# Patient Record
Sex: Male | Born: 1940 | Race: White | Hispanic: No | Marital: Married | Smoking: Heavy tobacco smoker
Health system: Southern US, Community
[De-identification: ages and names within clinical notes are randomized; demographics above are authoritative.]

## PROBLEM LIST (undated history)

## (undated) DIAGNOSIS — I10 Essential (primary) hypertension: Secondary | ICD-10-CM

## (undated) DIAGNOSIS — I639 Cerebral infarction, unspecified: Secondary | ICD-10-CM

## (undated) DIAGNOSIS — H539 Unspecified visual disturbance: Secondary | ICD-10-CM

## (undated) DIAGNOSIS — I4891 Unspecified atrial fibrillation: Secondary | ICD-10-CM

## (undated) HISTORY — DX: Cerebral infarction, unspecified: I63.9

## (undated) HISTORY — DX: Unspecified visual disturbance: H53.9

## (undated) HISTORY — DX: Essential (primary) hypertension: I10

## (undated) HISTORY — DX: Unspecified atrial fibrillation: I48.91

---

## 2019-06-15 ENCOUNTER — Inpatient Hospital Stay (HOSPITAL_BASED_OUTPATIENT_CLINIC_OR_DEPARTMENT_OTHER): Payer: Managed Care, Other (non HMO)

## 2019-06-15 ENCOUNTER — Other Ambulatory Visit: Payer: Self-pay

## 2019-06-15 ENCOUNTER — Inpatient Hospital Stay (HOSPITAL_COMMUNITY): Payer: Managed Care, Other (non HMO)

## 2019-06-15 ENCOUNTER — Encounter (HOSPITAL_COMMUNITY): Payer: Self-pay | Admitting: Emergency Medicine

## 2019-06-15 ENCOUNTER — Observation Stay (HOSPITAL_COMMUNITY)
Admission: EM | Admit: 2019-06-15 | Discharge: 2019-06-16 | Disposition: A | Discharge status: Home / Self Care | Payer: Managed Care, Other (non HMO) | Attending: Internal Medicine | Admitting: Internal Medicine

## 2019-06-15 DIAGNOSIS — Z20822 Contact with and (suspected) exposure to covid-19: Secondary | ICD-10-CM | POA: Diagnosis not present

## 2019-06-15 DIAGNOSIS — Z79899 Other long term (current) drug therapy: Secondary | ICD-10-CM | POA: Insufficient documentation

## 2019-06-15 DIAGNOSIS — I6389 Other cerebral infarction: Secondary | ICD-10-CM

## 2019-06-15 DIAGNOSIS — Z6828 Body mass index (BMI) 28.0-28.9, adult: Secondary | ICD-10-CM | POA: Insufficient documentation

## 2019-06-15 DIAGNOSIS — F17210 Nicotine dependence, cigarettes, uncomplicated: Secondary | ICD-10-CM | POA: Insufficient documentation

## 2019-06-15 DIAGNOSIS — Z23 Encounter for immunization: Secondary | ICD-10-CM | POA: Diagnosis not present

## 2019-06-15 DIAGNOSIS — E785 Hyperlipidemia, unspecified: Secondary | ICD-10-CM | POA: Insufficient documentation

## 2019-06-15 DIAGNOSIS — Z7982 Long term (current) use of aspirin: Secondary | ICD-10-CM | POA: Diagnosis not present

## 2019-06-15 DIAGNOSIS — E663 Overweight: Secondary | ICD-10-CM | POA: Diagnosis not present

## 2019-06-15 DIAGNOSIS — I10 Essential (primary) hypertension: Secondary | ICD-10-CM | POA: Insufficient documentation

## 2019-06-15 DIAGNOSIS — Z8249 Family history of ischemic heart disease and other diseases of the circulatory system: Secondary | ICD-10-CM | POA: Diagnosis not present

## 2019-06-15 DIAGNOSIS — E7800 Pure hypercholesterolemia, unspecified: Secondary | ICD-10-CM | POA: Insufficient documentation

## 2019-06-15 DIAGNOSIS — I639 Cerebral infarction, unspecified: Secondary | ICD-10-CM | POA: Diagnosis present

## 2019-06-15 LAB — COMPREHENSIVE METABOLIC PANEL
ALT: 34 U/L (ref 0–44)
AST: 33 U/L (ref 15–41)
Albumin: 3.9 g/dL (ref 3.5–5.0)
Alkaline Phosphatase: 48 U/L (ref 38–126)
Anion gap: 11 (ref 5–15)
BUN: 11 mg/dL (ref 8–23)
CO2: 26 mmol/L (ref 22–32)
Calcium: 10 mg/dL (ref 8.9–10.3)
Chloride: 103 mmol/L (ref 98–111)
Creatinine, Ser: 1.16 mg/dL (ref 0.61–1.24)
GFR calc Af Amer: >60 mL/min (ref >60)
GFR calc non Af Amer: >60 mL/min (ref >60)
Glucose, Bld: 109 mg/dL — ABNORMAL HIGH (ref 70–99)
Potassium: 4.2 mmol/L (ref 3.5–5.1)
Sodium: 140 mmol/L (ref 135–145)
Total Bilirubin: 0.9 mg/dL (ref 0.3–1.2)
Total Protein: 6.9 g/dL (ref 6.5–8.1)

## 2019-06-15 LAB — HEMOGLOBIN A1C
Hgb A1c MFr Bld: 5.5 % (ref 4.8–5.6)
Mean Plasma Glucose: 111.15 mg/dL

## 2019-06-15 LAB — CBC
HCT: 47.4 % (ref 39.0–52.0)
Hemoglobin: 16.2 g/dL (ref 13.0–17.0)
MCH: 33.8 pg (ref 26.0–34.0)
MCHC: 34.2 g/dL (ref 30.0–36.0)
MCV: 98.8 fL (ref 80.0–100.0)
Platelets: 240 10*3/uL (ref 150–400)
RBC: 4.8 MIL/uL (ref 4.22–5.81)
RDW: 12.6 % (ref 11.5–15.5)
WBC: 7.5 10*3/uL (ref 4.0–10.5)
nRBC: 0 % (ref 0.0–0.2)

## 2019-06-15 LAB — DIFFERENTIAL
Abs Immature Granulocytes: 0.02 10*3/uL (ref 0.00–0.07)
Basophils Absolute: 0 10*3/uL (ref 0.0–0.1)
Basophils Relative: 1 %
Eosinophils Absolute: 0.2 10*3/uL (ref 0.0–0.5)
Eosinophils Relative: 2 %
Immature Granulocytes: 0 %
Lymphocytes Relative: 17 %
Lymphs Abs: 1.3 10*3/uL (ref 0.7–4.0)
Monocytes Absolute: 0.5 10*3/uL (ref 0.1–1.0)
Monocytes Relative: 6 %
Neutro Abs: 5.5 10*3/uL (ref 1.7–7.7)
Neutrophils Relative %: 74 %

## 2019-06-15 LAB — LIPID PANEL
Cholesterol: 135 mg/dL (ref 0–200)
HDL: 48 mg/dL (ref >40)
LDL Cholesterol: 70 mg/dL (ref 0–99)
Total CHOL/HDL Ratio: 2.8 RATIO
Triglycerides: 85 mg/dL (ref <150)
VLDL: 17 mg/dL (ref 0–40)

## 2019-06-15 LAB — APTT: aPTT: 24 seconds (ref 24–36)

## 2019-06-15 LAB — PROTIME-INR
INR: 1 (ref 0.8–1.2)
Prothrombin Time: 13.3 seconds (ref 11.4–15.2)

## 2019-06-15 LAB — SARS CORONAVIRUS 2 (TAT 6-24 HRS): SARS Coronavirus 2: NEGATIVE

## 2019-06-15 MED ORDER — VALSARTAN-HYDROCHLOROTHIAZIDE 160-12.5 MG PO TABS: 1.0000 | ORAL_TABLET | Freq: Every day | ORAL | Status: DC

## 2019-06-15 MED ORDER — INFLUENZA VAC A&B SA ADJ QUAD 0.5 ML IM PRSY
0.5000 mL | PREFILLED_SYRINGE | INTRAMUSCULAR | Status: AC
  Administered 2019-06-16: 0.5 mL via INTRAMUSCULAR
  Filled 2019-06-15: qty 0.5

## 2019-06-15 MED ORDER — ASPIRIN EC 325 MG PO TBEC
325.0000 mg | DELAYED_RELEASE_TABLET | Freq: Every day | ORAL | Status: DC
  Administered 2019-06-16: 325 mg via ORAL
  Filled 2019-06-15: qty 1

## 2019-06-15 MED ORDER — ACETAMINOPHEN 650 MG RE SUPP: 650.0000 mg | Freq: Four times a day (QID) | RECTAL | Status: DC | PRN

## 2019-06-15 MED ORDER — HYDROCHLOROTHIAZIDE 12.5 MG PO CAPS
12.5000 mg | ORAL_CAPSULE | Freq: Every day | ORAL | Status: DC
  Administered 2019-06-16: 11:00:00 12.5 mg via ORAL
  Filled 2019-06-15 (×2): qty 1

## 2019-06-15 MED ORDER — ROSUVASTATIN CALCIUM 5 MG PO TABS
10.0000 mg | ORAL_TABLET | Freq: Every day | ORAL | Status: DC
  Administered 2019-06-15: 10 mg via ORAL
  Filled 2019-06-15: qty 2

## 2019-06-15 MED ORDER — ATORVASTATIN CALCIUM 40 MG PO TABS: 40.0000 mg | ORAL_TABLET | Freq: Every day | ORAL | Status: DC

## 2019-06-15 MED ORDER — ASPIRIN EC 81 MG PO TBEC
81.0000 mg | DELAYED_RELEASE_TABLET | Freq: Every day | ORAL | Status: DC
  Administered 2019-06-15: 81 mg via ORAL
  Filled 2019-06-15: qty 1

## 2019-06-15 MED ORDER — FOLIC ACID 1 MG PO TABS
1.0000 mg | ORAL_TABLET | Freq: Every day | ORAL | Status: DC
  Administered 2019-06-15 – 2019-06-16 (×2): 1 mg via ORAL
  Filled 2019-06-15 (×2): qty 1

## 2019-06-15 MED ORDER — THIAMINE HCL 100 MG/ML IJ SOLN: 100.0000 mg | Freq: Every day | INTRAMUSCULAR | Status: DC

## 2019-06-15 MED ORDER — ADULT MULTIVITAMIN W/MINERALS CH
1.0000 | ORAL_TABLET | Freq: Every day | ORAL | Status: DC
  Administered 2019-06-15 – 2019-06-16 (×2): 1 via ORAL
  Filled 2019-06-15 (×2): qty 1

## 2019-06-15 MED ORDER — ENOXAPARIN SODIUM 40 MG/0.4ML ~~LOC~~ SOLN
40.0000 mg | SUBCUTANEOUS | Status: DC
  Administered 2019-06-15: 40 mg via SUBCUTANEOUS
  Filled 2019-06-15: qty 0.4

## 2019-06-15 MED ORDER — IRBESARTAN 150 MG PO TABS
150.0000 mg | ORAL_TABLET | Freq: Every day | ORAL | Status: DC
  Administered 2019-06-16: 11:00:00 150 mg via ORAL
  Filled 2019-06-15 (×2): qty 1

## 2019-06-15 MED ORDER — THIAMINE HCL 100 MG PO TABS
100.0000 mg | ORAL_TABLET | Freq: Every day | ORAL | Status: DC
  Administered 2019-06-15 – 2019-06-16 (×2): 100 mg via ORAL
  Filled 2019-06-15 (×2): qty 1

## 2019-06-15 MED ORDER — ONDANSETRON HCL 4 MG PO TABS: 4.0000 mg | ORAL_TABLET | Freq: Four times a day (QID) | ORAL | Status: DC | PRN

## 2019-06-15 MED ORDER — ACETAMINOPHEN 325 MG PO TABS: 650.0000 mg | ORAL_TABLET | Freq: Four times a day (QID) | ORAL | Status: DC | PRN

## 2019-06-15 MED ORDER — ONDANSETRON HCL 4 MG/2ML IJ SOLN: 4.0000 mg | Freq: Four times a day (QID) | INTRAMUSCULAR | Status: DC | PRN

## 2019-06-15 NOTE — Progress Notes (Signed)
Carotid artery duplex has been completed. Preliminary results can be found in CV Proc through chart review.   06/15/19 1:05 PM Olen Cordial RVT

## 2019-06-15 NOTE — ED Notes (Signed)
Lab to add on lipid panel and H A1c.

## 2019-06-15 NOTE — Consult Note (Signed)
Referring Physician: Dr. 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 male presenting for stroke work up at the request of his PCP after MRI done at [facility] on 2/2 revealed findings concerning for subacute left MCA territory infarcts. The MRI was scheduled in order to evaluate recent change in behavior and personality lasting for about 2 days, which was noted by his wife. This occurred approximately 4 weeks ago and then resolved. Other symptoms precipitating the scheduling of the MRI included approximately 2 days of halting speech and word finding difficulty during the period of behavioral change. He has no history of stroke.   The patient states that all of the above symptoms have resolved. He has no other complaints.   PMHx HTN High cholesterol  PSHx None  FHx Father with MI  SocHx Smokes 3/4 ppd x 10 years.   Medications: Valsartan Rosuvastatin ASA 81 mg po qd  Allergies: NKDA   ROS: As per HPI. Comprehensive ROS otherwise negative.  Physical Examination: Blood pressure (!) 157/90, pulse 86, temperature 98.8 F (37.1 C), temperature source Oral, resp. rate 16, SpO2 96 %.  HEENT: [place]/AT Lungs: Respirations unlabored Ext: No edema  Neurologic Examination: Mental Status: Alert, fully oriented, thought content appropriate.  Speech fluent except for intermittent, subtle hesitancy. Intact naming and comprehension.  Able to follow all commands without difficulty.  Cranial Nerves: II:  Visual fields intact. PERRL.  III,IV, VI: No ptosis. EOMI. No nystagmus.   V,VII: Smile symmetric. Facial temp sensation intact bilaterally.  VIII: Hearing intact to voice IX,X: Palate rises symmetrically XI: Symmetric XII: Midline tongue extension  Motor: Right : Upper extremity   5/5    Left:     Upper extremity   5/5  Lower extremity   5/5     Lower extremity   5/5 No pronator drift.  Sensory: Temp and FT intact  bilaterally.  Deep Tendon Reflexes:  1+ bilateral brachioradialis and biceps Right patellar 0, left patellar 1+ Achilles 0 bilaterally Toes downgoing bilaterally Cerebellar: No ataxia with FNF bilaterally.  Gait: Deferred   MRI brain Feb 2 at [facility]: Findings concerning for subacute left MCA territory infarcts. No associated hemorrhage or mass effect.  Assessment: 79 y.o. male presenting for stroke work up at the request of his PCP after MRI done at [facility] on 2/2 revealed findings concerning for subacute left MCA territory infarcts. 1. Exam reveals no focal deficits.  2. Stroke Risk Factors - HTN and hypercholesterolemia  Recommendations: 1. TTE 2. Repeat MRI brain  3. MRA of the brain without contrast 4. Carotid ultrasound 5. Cardiac telemetry 6. PT consult, OT consult, Speech consult 7. Increase ASA to 325 mg po qd 8. Start atorvastatin 40 mg po qd 9. BP management. Out of permissive HTN time window 10. Frequent neuro checks 11. HgbA1c, fasting lipid panel  @Electronically  signed: Dr.  06/15/2019, 10:25 AM

## 2019-06-15 NOTE — ED Provider Notes (Signed)
[facility] EMERGENCY DEPARTMENT Provider Note   CSN: [id number] Arrival date & time: 06/15/19  0932     History Chief Complaint  Patient presents with  . Stroke symptoms 3 weeks ago    Bruce Sanford is a 79 y.o. male.  HPI 79 year old male history of hypertension presents today reporting that he was told by his primary care doctor to come in for stroke evaluation.  He reports that 3 to 4 weeks ago he had an episode where he was confused.  He states he was seen by his primary care doctor and had a scan of his head done.  He reports that he was in a tube for some period of time with a banging noise, so I presume this is an MRI.  He states he was told he had some small strokes and need to have further evaluation.  He denies any lateralized deficits.  He denies any difficulty speaking, swallowing, or walking.     Impressions Performed At  IMPRESSION:  1. FINDINGS concerning for subacute left MCA territory infarcts. No associated hemorrhage or mass effect. Please correlate clinically.        ###CODE CRITICAL REPORT###    Automated notification pathway concerning the above report was activated at the time below.    ORDERING PROVIDER (Unless stated above may not be the provider contacted): MICHAEL C BADGER  TIME: 06/12/2019 5:23 PM    Electronically Signed by: Eddie Dibbles  [id number]    MRI Brain Head Wo Contrast (06/12/2019 4:32 PM EST)  Narrative Performed At  INDICATION: confusion and memory loss.    COMPARISON: None    TECHNIQUE: Multiplanar, multisequence MR imaging of the brain was obtained without IV contrast.    FINDINGS:  # Skull/marrow/soft tissues: No calvarial or skull base lesion.  # Orbits: Unremarkable.  # Sinuses: No air-fluid levels.  # Brain: Mild generalized parenchymal volume loss commensurate with age. Relative preservation of hippocampal volume. There is a confluent area of abnormal FLAIR and DWI hyperintense  signal intensity involving the cortex and underlying white matter lateral left frontal lobe and anterior insular cortex and additional smaller areas of similar FLAIR and DWI hyperintense signal intensity within the deep and superficial portions of the left parietal lobe. These areas of signal abnormality do not demonstrate significant decreased signal on the ADC map. Appearance suggests subacute infarcts. No associated hemorrhage or mass effect. There is no midline shift, hydrocephalus or extra-axial collections. Flow is demonstrated within the major vessels at the base of the brain.    # Additional comments: None.         History reviewed. No pertinent past medical history.  There are no problems to display for this patient.   History reviewed. No pertinent surgical history.     No family history on file.  Social History   Tobacco Use  . Smoking status: Heavy Tobacco Smoker    Packs/day: 0.50    Types: Cigarettes  . Smokeless tobacco: Never Used  Substance Use Topics  . Alcohol use: Not on file  . Drug use: Not on file    Home Medications Prior to Admission medications   Not on File    Allergies    Patient has no allergy information on record.  Review of Systems   Review of Systems  All other systems reviewed and are negative.   Physical Exam Updated Vital Signs BP (!) 157/90 (BP Location: Right Arm)   Pulse 86   Temp 98.8 F (37.1 C) (  Oral)   Resp 16   SpO2 96%   Physical Exam Vitals and nursing note reviewed.  Constitutional:      Appearance: Normal appearance.  HENT:     Head: Normocephalic.     Right Ear: External ear normal.     Left Ear: External ear normal.     Nose: Nose normal.     Mouth/Throat:     Mouth: Mucous membranes are moist.  Eyes:     Pupils: Pupils are equal, round, and reactive to light.  Cardiovascular:     Rate and Rhythm: Normal rate and regular rhythm.  Pulmonary:     Effort: Pulmonary effort is normal.     Breath  sounds: Normal breath sounds.  Abdominal:     General: Abdomen is flat.     Palpations: Abdomen is soft.  Musculoskeletal:        General: Normal range of motion.     Cervical back: Normal range of motion.  Skin:    General: Skin is warm.     Capillary Refill: Capillary refill takes less than 2 seconds.  Neurological:     General: No focal deficit present.     Mental Status: He is alert and oriented to person, place, and time.     Cranial Nerves: No cranial nerve deficit.     Sensory: No sensory deficit.     Motor: No weakness.     Coordination: Coordination normal.     Gait: Gait normal.     Deep Tendon Reflexes: Reflexes normal.  Psychiatric:        Mood and Affect: Mood normal.        Behavior: Behavior normal.     ED Results / Procedures / Treatments   Labs (all labs ordered are listed, but only abnormal results are displayed) Labs Reviewed - No data to display  EKG EKG Interpretation  Date/Time:  Friday June 15 2019 09:45:05 EST Ventricular Rate:  85 PR Interval:    QRS Duration: 86 QT Interval:  384 QTC Calculation: 457 R Axis:   76 Text Interpretation: Normal sinus rhythm Premature atrial complexes No old tracing to compare Confirmed by Margarita Grizzle [phone]) on 06/15/2019 10:09:10 AM   Radiology No results found.  Procedures Procedures (including critical care time)  Medications Ordered in ED Medications - No data to display  ED Course  I have reviewed the triage vital signs and the nursing notes.  Pertinent labs & imaging results that were available during my care of the patient were reviewed by me and considered in my medical decision making (see chart for details).    MDM Rules/Calculators/A&P                       Patient presents with nonfocal exam but new findings on mri.  Neurology consulted and advise admission for full stroke work up. Discussed with Dr.  Marlise Eves IM (on for unassigned) and will see for admission  Final Clinical  Impression(s) / ED Diagnoses Final diagnoses:  Cerebrovascular accident (CVA), unspecified mechanism [facility])    Rx / DC Orders ED Discharge Orders    None       Margarita Grizzle, MD 06/15/19 1131

## 2019-06-15 NOTE — Progress Notes (Signed)
  Echocardiogram 2D Echocardiogram has been performed.  Celene Skeen 06/15/2019, 4:09 PM

## 2019-06-15 NOTE — Progress Notes (Signed)
Attempted echo.  Patient going to MRI.  Will attempt at a later time.

## 2019-06-15 NOTE — ED Notes (Signed)
Pt in MRI/Echo. Report called to Sian RN on 3W. Will transport to 3W upon return to ED.

## 2019-06-15 NOTE — ED Triage Notes (Signed)
Pt coming from home. Pt complaint of stroke symptoms 3 weeks ago. Sent by Dr. To get tests done faster. VSS. NAD.

## 2019-06-15 NOTE — H&P (Addendum)
Date: 06/15/2019               Patient Name:  Bruce Sanford MRN: [id number]  DOB: [birth year] Age / Sex: 79 y.o., male   PCP: Chesley Noon, MD         Medical Service: Internal Medicine Teaching Service         Attending Physician: Dr. Aldine Contes, MD    First Contact: Dr. Court Joy Pager: [phone]  Second Contact: Dr. Sherry Ruffing Pager: [phone]       After Hours (After 5p/  First Contact Pager: [phone]  weekends / holidays): Second Contact Pager: [phone]   Chief Complaint: Confusion   History of Present Illness: Eris Breck is a 79 year old male with a history of hypertension, hyperlipidemia, and tobacco use disorder who presented to the emergency department for stroke evaluation. History was obtained via the patient and through chart review.  Approximately 3 to 4 weeks ago the patient had 48 hours of confusion. He states that he had difficulty time remembering details and concentrating. This subsequently resolved but he went to his primary care doctors a week later at his wife's urging. An MRI was obtained that illustrated multiple infarcts. He was referred to neurology recommended he come to the emergency department for further evaluation/workup. Exact timeline is not clear. He states that he otherwise has been feeling well without fevers, chills, headaches, sinus congestion, sore throat, shortness of breath, palpitations, abdominal pain, diarrhea/constipation, dysuria, rashes, myalgias, arthralgias, pain in his calves with ambulation, focal neurological deficits.  Meds:  Current Meds  Medication Sig  . Aspirin Buf,CaCarb-MgCarb-MgO, 81 MG TABS Take 81 mg by mouth daily.  . Cholecalciferol 25 MCG (1000 UT) capsule Take 1,000 Units by mouth daily.  . Multiple Vitamin (MULTIVITAMIN) capsule Take 1 capsule by mouth daily.  . Omega-3 350 MG CPDR Take 3 capsules by mouth 2 (two) times daily.  . psyllium (METAMUCIL) 58.6 % packet Take 1 packet by mouth 2 (two) times daily.    . rosuvastatin (CRESTOR) 10 MG tablet Take 10 mg by mouth daily.  . valsartan-hydrochlorothiazide (DIOVAN-HCT) 160-12.5 MG tablet Take 1 tablet by mouth daily.   Allergies: Allergies as of 06/15/2019  . (Not on File)   History reviewed. No pertinent past medical history.  Family History: His father died at the age of 66 from a massive MI. No other significant past medical history of per the patient.  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 He is divorced and remarried. He has five kids in total. He smokes three fourths pack per day and has been doing so over the last 17 years. He drinks 3 to 4 glasses of gin per night. He denies use of other illicit substances.  Review of Systems: A complete ROS was negative except as per HPI.  Physical Exam: Blood pressure 137/76, pulse 85, temperature 98.8 F (37.1 C), temperature source Oral, resp. rate 15, SpO2 92 %.  General: Well nourished male in no acute distress HENT: Normocephalic, atraumatic, moist mucus membranes Pulm: Good air movement with no wheezing or crackles  CV: RRR, no murmurs, no rubs  Abdomen: Active bowel sounds, soft, non-distended, no tenderness to palpation  Extremities: Pulses palpable in all extremities, no LE edema  Skin: Warm and dry  Neuro: Alert and  oriented x 3 - Cranial nerves II-XII intact bilaterally, does have some saccades with right word gaze - Right upper extremity's gross strength five out of five with intact sensation to light touch and temperature.  - Left upper extremity's gross strength five out of five with intact sensation to light touch and temperature.  - Right lower extremity's gross strength five out of five with intact sensation to light touch and temperature.  - Left lower  extremity's gross strength five out of five with intact sensation to light touch and temperature.   EKG: personally reviewed my interpretation is sinus rhythm with normal axis. Slight PR prolongation. No other conduction abnormalities noted.  Assessment & Plan by Problem: Active Problems:   CVA (cerebral vascular accident) (HCC)  Jahsir Rama is a 79 year old male with a history of hypertension, hyperlipidemia, and tobacco use disorder who presented to the emergency department for stroke evaluation.   Stroke Evaluation  - Check lipid panel and A1c  - Continue ASA 81 mg QD  - Continue rosuvastatin 10 mg QD  - Check echocardiogram with bubble study  - MRI/MRA pending - Will need carotid dopplers  - Admit to tele  - Appreciate neurology recs  HTN - Out of the window for permissive HTN  - Continue valsartan-HCTZ 160-12.5 mg QD  HLD - Continue Rosuvastatin   Tobacco use - Smokes 3/4 PPD  - Discussed cessation  - Declined nicotine patch   Alcohol use disorder - drinks 3-4 glasses of gin per night  - No history of withdrawals  - CIWA without ativan   Diet: Regular  VTE ppx: Lovenox  CODE STATUS: Full code  Dispo: Admit patient to Inpatient with expected length of stay greater than 2 midnights.  SignedLevora Dredge, MD 06/15/2019, 11:55 AM  Pager: [phone]

## 2019-06-15 NOTE — ED Notes (Signed)
Pt transported to MRI 

## 2019-06-16 ENCOUNTER — Other Ambulatory Visit: Payer: Self-pay | Admitting: Nurse Practitioner

## 2019-06-16 DIAGNOSIS — Z720 Tobacco use: Secondary | ICD-10-CM | POA: Diagnosis not present

## 2019-06-16 DIAGNOSIS — I10 Essential (primary) hypertension: Secondary | ICD-10-CM

## 2019-06-16 DIAGNOSIS — I63512 Cerebral infarction due to unspecified occlusion or stenosis of left middle cerebral artery: Secondary | ICD-10-CM

## 2019-06-16 DIAGNOSIS — E785 Hyperlipidemia, unspecified: Secondary | ICD-10-CM

## 2019-06-16 DIAGNOSIS — I639 Cerebral infarction, unspecified: Secondary | ICD-10-CM

## 2019-06-16 DIAGNOSIS — Z79899 Other long term (current) drug therapy: Secondary | ICD-10-CM

## 2019-06-16 DIAGNOSIS — Z7289 Other problems related to lifestyle: Secondary | ICD-10-CM

## 2019-06-16 MED ORDER — CLOPIDOGREL BISULFATE 75 MG PO TABS: 75.0000 mg | ORAL_TABLET | Freq: Every day | ORAL | 0 refills | Status: DC

## 2019-06-16 MED ORDER — ATORVASTATIN CALCIUM 40 MG PO TABS: 40.0000 mg | ORAL_TABLET | Freq: Every day | ORAL | 0 refills | Status: AC

## 2019-06-16 MED ORDER — CLOPIDOGREL BISULFATE 75 MG PO TABS
75.0000 mg | ORAL_TABLET | Freq: Every day | ORAL | Status: DC
  Administered 2019-06-16: 75 mg via ORAL
  Filled 2019-06-16: qty 1

## 2019-06-16 MED ORDER — ASPIRIN EC 81 MG PO TBEC: 81.0000 mg | DELAYED_RELEASE_TABLET | Freq: Every day | ORAL | Status: DC

## 2019-06-16 NOTE — Progress Notes (Signed)
   Subjective:   Patient is alert and awake on exam. Report no neurological deficits he can notice at this time. He ask if he will go home today. Discussed plan to touch base with neurology , but possible discharge today. Pt said he discussed smoking cessation with his wife and believes he can quit. He states smoking has helped with his anxiety in the past and I encourage to follow up with PCP to discuss this further.   Objective:  Vital signs in last 24 hours: Vitals:   06/15/19 1900 06/15/19 2130 06/15/19 2350 06/16/19 0432  BP: 137/74 125/68 136/77 130/76  Pulse: 77 76 63 63  Resp: 16 16 19 19   Temp: 98.5 F (36.9 C) 98.3 F (36.8 C) 98.1 F (36.7 C) 98.2 F (36.8 C)  TempSrc: Oral Oral Oral Oral  SpO2: 95% 96% 93% 98%  Weight: 86.2 kg     Height: 5\' 9"  (1.753 m)      . General: Alert, nl appearance HEENT: Normocephalic, atraumatic ,Conjunctivae normal Cardiovascular: Normal rate, regular rhythm.  No murmurs, rubs, or gallops Pulmonary : Effort normal, breath sounds normal. No wheezes, rales, or rhonchi Abdominal: no guarding, non tender, non distended, bowel sounds normal Musculoskeletal: no swelling , deformity, injury ,or tenderness in extremities, Skin: Warm, dry , erythema, or rash Neurological: alert and oriented x4 , cranial nerve II-XII intact, no weakness, no sensory deficit . FTN and Heel to shin intact.  Psychiatric/Behavioral: normal mood, normal behavior    Assessment/Plan:  Active Problems:   CVA (cerebral vascular accident) (HCC)  79 year old male with a history of height pretension, hyperlipidemia and tobacco use disorder who presented for stoke evaluation.  CVA -Subacute left MCA infarct on MRI.  No residual deficits on exam. - Risk factors : HTN, HLD .  - A1c 5.5, LDL 70.  TTE ,noted negative bubble study , moderate LV hypertrophy, mild aortic valve sclerosis wo stenosis, dilation of ascending aorta. Prelim read of carotid Martin Majestic appears  nl.  P: - Stroke neurology following, greatly appreciate recommendations - Aspirin 325 QD  - Atorvastatin 40 mg p.o. daily - Hypertension as below  #HTN -Out of the window of peripheral hypertension -Continue valsartan HCTZ 160-12.5 mg daily  #HLD - Atorvastatin 40  #Tobacco use -Smokes one quarter PPD - Decline nicotine patch on admission  P: - continue to encourage smoking cessation  #Alcohol use disorder No history of withdrawal - not scoring on CIWA P: CIWA without ativan    Prior to Admission Living Arrangement:home Anticipated Discharge Location:home Barriers to Discharge: stroke workup  Dispo: Anticipated discharge in approximately 0-1 day(s).   63, MD PGY1  See Attending Attestation Note for Final Recommendations.

## 2019-06-16 NOTE — Progress Notes (Signed)
Pt given discharge summary and discharged home via wife as transportation. 

## 2019-06-16 NOTE — Progress Notes (Signed)
STROKE TEAM PROGRESS NOTE   HISTORY OF PRESENT ILLNESS (per record) Bruce Sanford is an 79 y.o. male presenting for stroke work up at the request of his PCP after MRI done at [facility] on 2/2 revealed findings concerning for subacute left MCA territory infarcts. The MRI was scheduled in order to evaluate recent change in behavior and personality lasting for about 2 days, which was noted by his wife. This occurred approximately 4 weeks ago and then resolved. Other symptoms precipitating the scheduling of the MRI included approximately 2 days of halting speech and word finding difficulty during the period of behavioral change. He has no history of stroke.  The patient states that all of the above symptoms have resolved. He has no other complaints.    INTERVAL HISTORY I have personally reviewed history of presenting illness with the patient in details, electronic medical records and imaging films in PACS.  He states that about 3 weeks ago he had an episode of speech and language difficulties but did not seek medical help at that time.  His primary care physician ordered an outpatient MRI which showed subacute left MCA branch infarct.  MRA of the brain shows no significant vascular stenosis.  Carotid ultrasound is unremarkable.  2D echo showed normal ejection fraction without cardiac source of embolism.  LDL cholesterol 70 mg percent.  Hemoglobin A1c is 5.5. He denies any prior history of atrial fibrillation, palpitations or cardiac arrhythmias. OBJECTIVE Vitals:   06/15/19 2130 06/15/19 2350 06/16/19 0432 06/16/19 0700  BP: 125/68 136/77 130/76 120/62  Pulse: 76 63 63 75  Resp: 16 19 19 20   Temp: 98.3 F (36.8 C) 98.1 F (36.7 C) 98.2 F (36.8 C) 98.8 F (37.1 C)  TempSrc: Oral Oral Oral Oral  SpO2: 96% 93% 98% 96%  Weight:      Height:        CBC:  Recent Labs  Lab 06/15/19 1030  WBC 7.5  NEUTROABS 5.5  HGB 16.2  HCT 47.4  MCV 98.8  PLT 240    Basic Metabolic Panel:  Recent Labs   Lab 06/15/19 1030  NA 140  K 4.2  CL 103  CO2 26  GLUCOSE 109*  BUN 11  CREATININE 1.16  CALCIUM 10.0    Lipid Panel:     Component Value Date/Time   CHOL 135 06/15/2019 1030   TRIG 85 06/15/2019 1030   HDL 48 06/15/2019 1030   CHOLHDL 2.8 06/15/2019 1030   VLDL 17 06/15/2019 1030   LDLCALC 70 06/15/2019 1030   HgbA1c:  Lab Results  Component Value Date   HGBA1C 5.5 06/15/2019   Urine Drug Screen: No results found for: LABOPIA, COCAINSCRNUR, LABBENZ, AMPHETMU, THCU, LABBARB  Alcohol Level No results found for: ETH  IMAGING  MR BRAIN WO CONTRAST MR ANGIO HEAD WO CONTRAST 06/15/2019 IMPRESSION:  Subacute left MCA territory infarcts involving frontal and parietal lobes as well as anterior insula. No evidence of hemorrhage. No proximal intracranial vessel occlusion or significant stenosis.   ECHOCARDIOGRAM COMPLETE BUBBLE STUDY 06/16/2019 IMPRESSIONS   1. Left ventricular ejection fraction, by visual estimation, is 55 to 60%. The left ventricle has normal function. There is moderately increased left ventricular hypertrophy.   2. Global right ventricle has normal systolic function.The right ventricular size is mildly enlarged.   3. The mitral valve is normal in structure. No evidence of mitral valve regurgitation.   4. The aortic valve is tricuspid. Aortic valve regurgitation is not visualized. Mild aortic valve sclerosis without stenosis.  5. The pulmonic valve was not well visualized. Pulmonic valve regurgitation is trivial.   6. The tricuspid valve is normal in structure. Tricuspid valve regurgitation is not demonstrated.   7. 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   8. TR signal is inadequate for assessing pulmonary artery systolic pressure.   9. Technically difficult study, but bubble study appears negative with no evidence of interatrial shunt   VAS US CAROTID 06/15/2019 Summary:  Right Carotid: Velocities in the right ICA are consistent with a  1-39% stenosis.  Left Carotid: Velocities in the left ICA are consistent with a 1-39% stenosis.  Vertebrals: Bilateral vertebral arteries demonstrate antegrade flow.  Preliminary    ECG - SR rate 85 BPM. (See cardiology reading for complete details)   PHYSICAL EXAM Blood pressure 120/62, pulse 75, temperature 98.8 F (37.1 C), temperature source Oral, resp. rate 20, height 5\' 9"  (1.753 m), weight 86.2 kg, SpO2 96 %. Pleasant elderly Caucasian male not in distress. . Afebrile. Head is nontraumatic. Neck is supple without bruit.    Cardiac exam no murmur or gallop. Lungs are clear to auscultation. Distal pulses are well felt. Neurological Exam ;  Awake  Alert oriented x 3. Normal speech and language.eye movements full without nystagmus.fundi were not visualized. Vision acuity and fields appear normal. Hearing is normal. Palatal movements are normal. Face symmetric. Tongue midline. Normal strength, tone, reflexes and coordination. Normal sensation. Gait deferred.     ASSESSMENT/PLAN Mr. Bruce Sanford is a 79 y.o. male with history of tobacco use, ETOH, Htn, and Hld presenting for stroke work up at the request of his PCP after MRI done at [facility] on 2/2 revealed findings concerning for subacute left MCA territory infarcts. The MRI was scheduled in order to evaluate change in behavior and personality with speech difficulties lasting for about 2 days, that occurred approximately 4 weeks ago and then resolved.  He did not receive IV t-PA due to late presentation (>4.5 hours from time of onset) and resolution of deficits.  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   Resultant mild expressive aphasia which has improved  Code Stroke CT Head - not ordered  CT head - not ordered  MRI head - Subacute left MCA territory infarcts involving frontal and parietal lobes as well as anterior insula. No evidence of hemorrhage.  MRA head - no significant  stenosis  CTA H&N - not ordered  CT Perfusion - not ordered  Carotid Doppler - unremarkable  2D Echo - EF 55 to 60% . No cardiac source of emboli identified.   Sars Corona Virus 2 - negative  LDL - 70  HgbA1c - 5.5  UDS - not ordered  VTE prophylaxis - Lovenox Diet  Diet Order            Diet regular Room service appropriate? Yes; Fluid consistency: Thin  Diet effective now              aspirin 81 mg daily prior to admission, now on aspirin 325 mg daily  Patient counseled to be compliant with his antithrombotic medications  Ongoing aggressive stroke risk factor management  Therapy recommendations:  pending  Disposition:  Pending  Hypertension  Home BP meds: Diovan HCT  Current BP meds: Diovan HCT  Stable . Permissive hypertension (OK if < 220/120) but gradually normalize in 5-7 days  . Long-term BP goal normotensive  Hyperlipidemia  Home Lipid lowering medication: Crestor 10 mg daily  LDL 70, goal < 70  Current lipid lowering medication: Lipitor 40 mg daily  Continue statin at discharge  Other Stroke Risk Factors  Advanced age  Cigarette smoker - advised to stop smoking  Overweight, Body mass index is 28.06 kg/m., recommend weight loss, diet and exercise as appropriate   Family hx - not on file  Other Active Problems  Code status - Full code   Hospital day # 1  I have personally obtained history,examined this patient, reviewed notes, independently viewed imaging studies, participated in medical decision making and plan of care.ROS completed by me personally and pertinent positives fully documented  I have made any additions or clarifications directly to the above note.  Patient presented with episode of speech language difficulties 3 weeks ago which has improved but MRI shows subacute left MCA infarct likely of embolic etiology from unidentified source.  Recommend 30-day external heart monitor for paroxysmal A. fib after discharge.  Aspirin  and Plavix for 3 weeks followed by Plavix alone.  Aggressive risk factor modification.  Follow-up as an outpatient in the stroke clinic in 6 weeks.  Greater than 50% time during this 25-minute visit was spent on counseling and coordination of care and about his cryptogenic stroke and answering questions.  Antony Contras, MD Medical Director [facility] Pager: [phone] 06/16/2019 11:52 AM   To contact Stroke Continuity provider, please refer to [email or web address] After hours, contact General Neurology

## 2019-06-16 NOTE — Care Management CC44 (Signed)
Condition Code 44 Documentation Completed  Patient Details  Name: Bruce Sanford MRN: [id number] Date of Birth: [birth year]   Condition Code 44 given:    Patient signature on Condition Code 44 notice:    Documentation of 2 MD's agreement:    Code 44 added to claim:     This CM called the unit directly after notification from [facility] to do CC44. Patient was already discharged off the unit and could not be reached.    Lawerance Sabal, RN 06/16/2019, 2:25 PM

## 2019-06-16 NOTE — Evaluation (Signed)
Occupational Therapy Evaluation Patient Details Name: Bruce Sanford MRN: [id number] DOB: [birth year] Today's Date: 06/16/2019    History of Present Illness 79 yo male with onset of confusion and memory changes 3 weeks ago was admitted recently by PCP to receive further testing, referred to PT. Has noted subacute L MCA infarct affecting frontal, parietal and anterior insula regions.  PMHx:  HTN, HLD   Clinical Impression   Patient evaluated by Occupational Therapy with no further acute OT needs identified. All education has been completed and the patient has no further questions. Pt appears back to baseline.  He is able to perform ADLs and IADLs independently.  He scored 0/28 (no errors) on the Short Blessed Test  See below for any follow-up Occupational Therapy or equipment needs. OT is signing off. Thank you for this referral.      Follow Up Recommendations  No OT follow up    Equipment Recommendations  None recommended by OT    Recommendations for Other Services       Precautions / Restrictions Precautions Precautions: None Precaution Comments: no recent falls Restrictions Weight Bearing Restrictions: No      Mobility Bed Mobility Overal bed mobility: Independent                Transfers Overall transfer level: Independent                    Balance Overall balance assessment: Independent                                         ADL either performed or assessed with clinical judgement   ADL Overall ADL's : Independent                                             Vision Baseline Vision/History: Wears glasses Wears Glasses: Reading only Patient Visual Report: No change from baseline Vision Assessment?: Yes Eye Alignment: Within Functional Limits Ocular Range of Motion: Within Functional Limits Alignment/Gaze Preference: Within Defined Limits Tracking/Visual Pursuits: Able to track stimulus in all quads without  difficulty Saccades: Within functional limits Convergence: Within functional limits Visual Fields: No apparent deficits     Development worker, international aid Tested?: Yes   Praxis Praxis Praxis tested?: Within functional limits    Pertinent Vitals/Pain Pain Assessment: No/denies pain     Hand Dominance Right   Extremity/Trunk Assessment Upper Extremity Assessment Upper Extremity Assessment: Overall WFL for tasks assessed   Lower Extremity Assessment Lower Extremity Assessment: Overall WFL for tasks assessed   Cervical / Trunk Assessment Cervical / Trunk Assessment: Normal   Communication Communication Communication: No difficulties   Cognition Arousal/Alertness: Awake/alert Behavior During Therapy: WFL for tasks assessed/performed Overall Cognitive Status: Within Functional Limits for tasks assessed                                 General Comments: Pt scored 0/28 on the short blessed test.  He made no errors    General Comments  Reviewed BEFAST with pt     Exercises Exercises: Other exercises(LE strength is WFL on BLE's)   Shoulder Instructions      Home Living Family/patient expects to be discharged to:: Private residence Living  Arrangements: Spouse/significant other Available Help at Discharge: Family;Available 24 hours/day Type of Home: House Home Access: Stairs to enter [employer] of Steps: 6 Entrance Stairs-Rails: Can reach both Home Layout: Two level Alternate Level Stairs-Number of Steps: 13 Alternate Level Stairs-Rails: Right     Bathroom Toilet: Standard     Home Equipment: None   Additional Comments: has been a runner in the past, 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      Prior Functioning/Environment Level of Independence: Independent                 OT Problem List: Decreased cognition      OT Treatment/Interventions:      OT Goals(Current goals can be found in the care  plan section) Acute Rehab OT Goals Patient Stated Goal: to go home and sell coins  OT Goal Formulation: All assessment and education complete, DC therapy  OT Frequency:     Barriers to D/C:            Co-evaluation              AM-PAC OT "6 Clicks" Daily Activity     Outcome Measure Help from another person eating meals?: None Help from another person taking care of personal grooming?: None Help from another person toileting, which includes using toliet, bedpan, or urinal?: None Help from another person bathing (including washing, rinsing, drying)?: None Help from another person to put on and taking off regular upper body clothing?: None Help from another person to put on and taking off regular lower body clothing?: None 6 Click Score: 24   End of Session    Activity Tolerance: Patient tolerated treatment well Patient left: Other (comment)(up in room )  OT Visit Diagnosis: Cognitive communication deficit (R41.841) Symptoms and signs involving cognitive functions: Cerebral infarction                Time: 9735-3299 OT Time Calculation (min): 20 min Charges:  OT General Charges $OT Visit: 1 Visit OT Evaluation $OT Eval Low Complexity: 1 Low  Nilsa Nutting., OTR/L Acute Rehabilitation Services Pager [phone] Office [phone]   Lucille Passy M 06/16/2019, 11:30 AM

## 2019-06-16 NOTE — Care Management Obs Status (Signed)
MEDICARE OBSERVATION STATUS NOTIFICATION   Patient Details  Name: Bruce Sanford MRN: [id number] Date of Birth: [birth year]   Medicare Observation Status Notification Given:  No This CM called the unit directly after notification from [facility] to do CC44. Patient was already discharged off the unit and could not be reached.    Lawerance Sabal, RN 06/16/2019, 2:24 PM

## 2019-06-16 NOTE — Plan of Care (Signed)
  Problem: Education: Goal: Knowledge of disease or condition will improve Outcome: Adequate for Discharge Goal: Knowledge of secondary prevention will improve Outcome: Adequate for Discharge   

## 2019-06-16 NOTE — Discharge Summary (Signed)
Name: Bruce Sanford MRN: [id number] DOB: [birth year] 79 y.o. PCP: Chesley Noon, MD  Date of Admission: 06/15/2019  9:34 AM Date of Discharge: 06/16/2019 Attending Physician: Gilles Chiquito B Discharge Diagnosis: 1. CVA  Discharge Medications: Allergies as of 06/16/2019   Not on File     Medication List    STOP taking these medications   rosuvastatin 10 MG tablet Commonly known as: CRESTOR     TAKE these medications   Aspirin Buf(CaCarb-MgCarb-MgO) 81 MG Tabs Take 81 mg by mouth daily. Notes to patient: 06/17/19   atorvastatin 40 MG tablet Commonly known as: LIPITOR Take 1 tablet (40 mg total) by mouth daily at 6 PM. Notes to patient: 06/16/19   Cholecalciferol 25 MCG (1000 UT) capsule Take 1,000 Units by mouth daily. Notes to patient: Continue home schedule   clopidogrel 75 MG tablet Commonly known as: PLAVIX Take 1 tablet (75 mg total) by mouth daily. Notes to patient: 06/17/19   multivitamin capsule Take 1 capsule by mouth daily. Notes to patient: 06/17/19   Omega-3 350 MG Cpdr Take 3 capsules by mouth 2 (two) times daily. Notes to patient: Continue home schedule   psyllium 58.6 % packet Commonly known as: METAMUCIL Take 1 packet by mouth 2 (two) times daily. Notes to patient: Continue home schedule   valsartan-hydrochlorothiazide 160-12.5 MG tablet Commonly known as: DIOVAN-HCT Take 1 tablet by mouth daily. Notes to patient: Continue home schedule       Disposition and follow-up:   Bruce Sanford was discharged from [facility] in Stable condition.  At the hospital follow up visit please address:  1.  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 No residual neurological deficits on exam.         - Modifiable Risk factors : HTN, HLD, tobacco use, alcohol use         - Etiology: Likely embolic, although source no identified.           - Plan for patient to see cardiology for external heart monitor placement to  monitor for                     Paroxysmal Afib           - Asprin + Plavix for 3 weeks , followed by Plavix alone          - Follow up with neurology in 6 weeks   2.  Labs / imaging needed at time of follow-up: na  3.  Pending labs/ test needing follow-up: na  Follow-up Appointments: Follow-up Information    [facility] Follow up.   Why: We will arrange for follow-up to place heart monitor within the next week. Contact information: [place] [address] [phone]       Chesley Noon, MD Follow up in 2 week(s).   Specialty: Family Medicine Why: Call and schedule follow up in 1-2 weeks Contact information: [place] [address] [phone]           Hospital Course by problem list: 1.   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  No residual deficits on exam. Modifiable risk factors discussed with patient , including HTN, HLD, Tabacco use, and alcohol use. Patient  A1c 5.5, LDL 70. TTE ,noted negative bubble study , moderate LV hypertrophy, mild aortic valve sclerosis wo  stenosis, dilation of ascending aorta. Carotid US nl. Stroke neurology consulted. Recommended Asprin and Plavix for 3 weeks, followed by Plavix alone.   #HTN -Out of the window of peripheral hypertension -Continue valsartan HCTZ 160-12.5 mg daily  #HLD - Atorvastatin 40  #Tobacco use -Smokes one quarter PPD - Decline nicotine patch on admission  P: - continue to encourage smoking cessation  #Alcohol use disorder No history of withdrawal - did not scoring on CIWA P: CIWA without ativan    Discharge Vitals:   BP (!) 146/85 (BP Location: Right Arm)   Pulse 72   Temp 98.2 F (36.8 C) (Oral)   Resp 19   Ht 5\' 9"  (1.753 m)   Wt 86.2 kg   SpO2 96%   BMI 28.06 kg/m   Pertinent Labs, Studies, and Procedures:  CBC Latest Ref Rng & Units 06/15/2019  WBC 4.0 - 10.5 K/uL 7.5   Hemoglobin 13.0 - 17.0 g/dL 08/13/2019  Hematocrit 13.2 - 52.0 % 47.4  Platelets 150 - 400 K/uL 240   BMP Latest Ref Rng & Units 06/15/2019  Glucose 70 - 99 mg/dL 08/13/2019)  BUN 8 - 23 mg/dL 11  Creatinine 102(V - 2.53 mg/dL 6.64  Sodium 4.03 - 474 mmol/L 140  Potassium 3.5 - 5.1 mmol/L 4.2  Chloride 98 - 111 mmol/L 103  CO2 22 - 32 mmol/L 26  Calcium 8.9 - 10.3 mg/dL 259   Study Result  CLINICAL DATA:  Recent episode of confusion  EXAM: MRI HEAD WITHOUT CONTRAST  MRA HEAD WITHOUT CONTRAST  TECHNIQUE: Multiplanar, multiecho pulse sequences of the brain and surrounding structures were obtained without intravenous contrast. Angiographic images of the head were obtained using MRA technique without contrast.  COMPARISON:  None.  FINDINGS: MRI HEAD FINDINGS  Brain: There is no acute infarction. Minimal patchy diffusion hyperintensity is present in the left frontal and parietal lobes as well as the anterior insula. There is corresponding T2 hyperintensity. Minimal patchy T2 hyperintensity in the supratentorial white matter is nonspecific and may reflect minor chronic microvascular ischemic changes. No evidence of hemorrhage. There is no intracranial mass, mass effect, or edema. There is no extra-axial fluid collection.  Vascular: Major vessel flow voids at the skull base are preserved.  Skull and upper cervical spine: Normal marrow signal is preserved.  Sinuses/Orbits: Mild mucosal thickening.  Orbits are unremarkable.  Other: Sella is unremarkable.  Mastoid air cells are clear.  MRA HEAD FINDINGS  Intracranial internal carotid arteries are patent. Middle and anterior cerebral arteries are patent. Intracranial vertebral arteries, basilar artery, posterior cerebral arteries are patent. There is no significant stenosis or aneurysm.  IMPRESSION: Subacute left MCA territory infarcts involving frontal and parietal lobes as well as anterior insula. No evidence of  hemorrhage.  No proximal intracranial vessel occlusion or significant stenosis.      MRI HEAD FINDINGS  Brain: There is no acute infarction. Minimal patchy diffusion hyperintensity is present in the left frontal and parietal lobes as well as the anterior insula. There is corresponding T2 hyperintensity. Minimal patchy T2 hyperintensity in the supratentorial white matter is nonspecific and may reflect minor chronic microvascular ischemic changes. No evidence of hemorrhage. There is no intracranial mass, mass effect, or edema. There is no extra-axial fluid collection.  Vascular: Major vessel flow voids at the skull base are preserved.  Skull and upper cervical spine: Normal marrow signal is preserved.  Sinuses/Orbits: Mild mucosal thickening.  Orbits are unremarkable.  Other: Sella is unremarkable.  Mastoid air cells are  clear.  MRA HEAD FINDINGS  Intracranial internal carotid arteries are patent. Middle and anterior cerebral arteries are patent. Intracranial vertebral arteries, basilar artery, posterior cerebral arteries are patent. There is no significant stenosis or aneurysm.  IMPRESSION: Subacute left MCA territory infarcts involving frontal and parietal lobes as well as anterior insula. No evidence of hemorrhage.  No proximal intracranial vessel occlusion or significant stenosis.   Carotid Arterial Duplex Study   Summary:  Right Carotid: Velocities in the right ICA are consistent with a 1-39%  stenosis.   Left Carotid: Velocities in the left ICA are consistent with a 1-39%  stenosis.   Vertebrals: Bilateral vertebral arteries demonstrate antegrade flow.   *See table(s) above for measurements and observations.    Electronically signed by Delia Heady MD on 06/16/2019 at 11:01:37 AM.   Final    Echo Complete Bubble Study  IMPRESSIONS    1. Left ventricular ejection fraction, by visual estimation, is 55 to  60%. The left ventricle has  normal function. There is moderately increased  left ventricular hypertrophy.  2. Global right ventricle has normal systolic function.The right  ventricular size is mildly enlarged.  3. The mitral valve is normal in structure. No evidence of mitral valve  regurgitation.  4. The aortic valve is tricuspid. Aortic valve regurgitation is not  visualized. Mild aortic valve sclerosis without stenosis.  5. The pulmonic valve was not well visualized. Pulmonic valve  regurgitation is trivial.  6. The tricuspid valve is normal in structure. Tricuspid valve  regurgitation is not demonstrated.  7. 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  8. TR signal is inadequate for assessing pulmonary artery systolic  pressure.  9. Technically difficult study, but bubble study appears negative with no  evidence of interatrial shunt   FINDINGS  Left Ventricle: Left ventricular ejection fraction, by visual estimation,  is 55 to 60%. The left ventricle has normal function. The left ventricle  has no regional wall motion abnormalities. There is moderately increased  left ventricular hypertrophy.  Left ventricular diastolic parameters were normal.   Right Ventricle: The right ventricular size is mildly enlarged. No  increase in right ventricular wall thickness. Global RV systolic function  is has normal systolic function.   Left Atrium: Left atrial size was normal in size.   Right Atrium: Right atrial size was not well visualized   Pericardium: Trivial pericardial effusion is present.   Mitral Valve: The mitral valve is normal in structure. No evidence of  mitral valve regurgitation.   Tricuspid Valve: The tricuspid valve is normal in structure. Tricuspid  valve regurgitation is not demonstrated.   Aortic Valve: The aortic valve is tricuspid. Aortic valve regurgitation is  not visualized. Mild aortic valve sclerosis is present, with no evidence  of aortic valve stenosis.    Pulmonic Valve: The pulmonic valve was not well visualized. Pulmonic valve  regurgitation is trivial. Pulmonic regurgitation is trivial.   Aorta: Aortic dilatation noted. There is dilatation of the ascending aorta  measuring 39 mm.   IAS/Shunts: The interatrial septum was not well visualized. Agitated  saline contrast was given intravenously to evaluate for intracardiac  shunting. Saline contrast bubble study was negative, with no evidence of  any interatrial shunt.      Discharge Instructions:   Signed:  Thurmon Fair, MD PGY1

## 2019-06-16 NOTE — Discharge Instructions (Addendum)
Mr.Gasparro  You were found to have a stroke secondary to a blockage in your left middle cerebral artery. The etiology is uncertain at this time after imaging of your heart and carotid arteries. Information for the cardiology group will be listed on this after visit summary. Call their office to schedule an appointment to place halter monitor this upcoming week.   In attempt to prevent another stroke we recommended modifying your risk factors which include the following: High blood pressure, high cholesterol, tobacco use, and alcohol use. You are on medications for your cholesterol and blood pressure. Your primary care doctor will need to follow you overtime to insure changes to your medications are not needed. If you are not able to stop smoking please talk to your care doctor about starting nicotine replacement. Current recommendation are should have no more than 4 drinks in one day or 14 drinks per week. A drink is base on 12 oz beer, 1.5 distilled spirits, or 5 oz glass of wine.   I encourage at least 150 minutes of moderate aerobic exercise per week.   Please follow up with you primary care doctor in 1-2 weeks. We will provide a discharge summary to your provider.

## 2019-06-16 NOTE — Evaluation (Signed)
Physical Therapy Evaluation and Discharge Patient Details Name: Bruce Sanford MRN: [id number] DOB: 03-23-1941 Today's Date: 06/16/2019   History of Present Illness  79 yo male with onset of confusion and memory changes 3 weeks ago was admitted recently by PCP to receive further testing, referred to PT. Has noted subacute L MCA infarct affecting frontal, parietal and anterior insula regions.  PMHx:  HTN, HLD  Clinical Impression  Pt was seen for evaluation of strength, coordination, gait and tolerance for longer walks.  He is not demonstrating coordination or strength changes, was able to safely be supervised on the stairs and is home with his wife who works from home.  If pt begins to demonstrate changes that are not currently being displayed please re-refer PT for this patient.  Discharging PT for now.    Follow Up Recommendations No PT follow up    Equipment Recommendations  None recommended by PT    Recommendations for Other Services       Precautions / Restrictions Precautions Precautions: None Precaution Comments: no recent falls Restrictions Weight Bearing Restrictions: No      Mobility  Bed Mobility Overal bed mobility: Modified Independent                Transfers Overall transfer level: Modified independent                  Ambulation/Gait Ambulation/Gait assistance: Min guard(for safety) Gait Distance (Feet): 350 Feet(200+150) Assistive device: None Gait Pattern/deviations: Step-through pattern;Narrow base of support Gait velocity: normal Gait velocity interpretation: 1.31 - 2.62 ft/sec, indicative of limited community ambulator General Gait Details: longer strides, very motivated  Stairs Stairs: Yes Stairs assistance: Supervision Stair Management: Two rails;Alternating pattern;Forwards Number of Stairs: 20 General stair comments: no signs of LOB  Wheelchair Mobility    Modified Rankin (Stroke Patients Only) Modified Rankin (Stroke  Patients Only) Pre-Morbid Rankin Score: No symptoms Modified Rankin: No significant disability     Balance Overall balance assessment: Independent                                           Pertinent Vitals/Pain Pain Assessment: No/denies pain    Home Living Family/patient expects to be discharged to:: Private residence Living Arrangements: Spouse/significant other Available Help at Discharge: Family;Available 24 hours/day Type of Home: House Home Access: Stairs to enter Entrance Stairs-Rails: Can reach both Entrance Stairs-Number of Steps: 6 Home Layout: Two level Home Equipment: None Additional Comments: has been a runner in the past, 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    Prior Function Level of Independence: Independent               Hand Dominance   Dominant Hand: Right    Extremity/Trunk Assessment   Upper Extremity Assessment Upper Extremity Assessment: Overall WFL for tasks assessed    Lower Extremity Assessment Lower Extremity Assessment: Overall WFL for tasks assessed       Communication   Communication: No difficulties  Cognition Arousal/Alertness: Awake/alert Behavior During Therapy: WFL for tasks assessed/performed Overall Cognitive Status: Within Functional Limits for tasks assessed                                 General Comments: pt is energetic but seems to be his baseline  General Comments General comments (skin integrity, edema, etc.): pt is walking fairly fast but is demonstrating his ability to move in the hopes of leaving hosp soon    Exercises     Assessment/Plan    PT Assessment Patent does not need any further PT services  PT Problem List Decreased cognition;Decreased safety awareness;Cardiopulmonary status limiting activity       PT Treatment Interventions Gait training;Therapeutic activities;Therapeutic exercise;Balance training    PT Goals (Current  goals can be found in the Care Plan section)  Acute Rehab PT Goals Patient Stated Goal: to go home as soon as possible PT Goal Formulation: All assessment and education complete, DC therapy    Frequency     Barriers to discharge Inaccessible home environment has stairs but demonstrated them    Co-evaluation               AM-PAC PT "6 Clicks" Mobility  Outcome Measure Help needed turning from your back to your side while in a flat bed without using bedrails?: None Help needed moving from lying on your back to sitting on the side of a flat bed without using bedrails?: None Help needed moving to and from a bed to a chair (including a wheelchair)?: None Help needed standing up from a chair using your arms (e.g., wheelchair or bedside chair)?: None Help needed to walk in hospital room?: None Help needed climbing 3-5 steps with a railing? : A Little 6 Click Score: 23    End of Session Equipment Utilized During Treatment: Gait belt Activity Tolerance: Patient tolerated treatment well Patient left: in bed;with call bell/phone within reach;with nursing/sitter in room Nurse Communication: Mobility status PT Visit Diagnosis: Other symptoms and signs involving the nervous system (R29.898)(abnormal MRI)    Time: 7893-8101 PT Time Calculation (min) (ACUTE ONLY): 18 min   Charges:   PT Evaluation $PT Eval Moderate Complexity: 1 Mod         Ramond Dial 06/16/2019, 11:22 AM   Mee Hives, PT MS Acute Rehab Dept. Number: [place]

## 2019-06-20 ENCOUNTER — Telehealth: Payer: Self-pay | Admitting: Radiology

## 2019-06-20 NOTE — Telephone Encounter (Signed)
Enrolled patient for a 30 day [employer] Event monitor to be mailed to patients home. HIPPA compliant instructions were left on patients voicemail.

## 2019-07-03 ENCOUNTER — Encounter: Payer: Self-pay | Admitting: Cardiology

## 2019-07-03 ENCOUNTER — Ambulatory Visit (INDEPENDENT_AMBULATORY_CARE_PROVIDER_SITE_OTHER): Payer: Managed Care, Other (non HMO)

## 2019-07-03 DIAGNOSIS — I4891 Unspecified atrial fibrillation: Secondary | ICD-10-CM | POA: Diagnosis not present

## 2019-07-03 DIAGNOSIS — I639 Cerebral infarction, unspecified: Secondary | ICD-10-CM | POA: Diagnosis not present

## 2019-07-04 ENCOUNTER — Telehealth: Payer: Self-pay

## 2019-07-04 NOTE — Telephone Encounter (Signed)
Spoke with the patient and his wife, Bruce Sanford regarding the critical cardiac event monitor received stating he was in Afib yesterday afternoon. Pt states he was not aware and denied any symptoms. He is aware to call back with any concerns. Reviewed by DOD, Dr. Eden Emms who advises to continue monitoring .

## 2019-07-04 NOTE — Telephone Encounter (Signed)
Received critical monitor results this AM showing that the patient was in Atrial Fibrillation on 2/23 -day 1 of 30.   Pt was recently discharged from the hospital on 06-16-2019 with subacute MCA infarct. Discharge diagnosis of CVA. Pt placed on Plavix and Aspirin, told to f/u with Neuro.   Pt mailed 30 day monitor 2/10  Pt had appt with Dr. Dewayne Hatch follow up scheduled for 3/26, Moved that appt up to 3/2  Spoke with Dr. Eden Emms DOD to let him know that the patient is not currently being anticoagulated and is in AFIB. Dr. Eden Emms states that will be up to Dr. Anne Fu to decide about anticoagulation. Dr. Ricki Miller recommendation remains-continue to monitor. No changes in medications.   Pt reports struggling with anxiety and is hoping he can get a medication to help with that. Encouraged patient to reach out to his PCP regarding anxiety concerns. Pt aware to call back with any further concerns.   Will route to [facility] for advisement.

## 2019-07-10 ENCOUNTER — Ambulatory Visit: Payer: Managed Care, Other (non HMO) | Admitting: Cardiology

## 2019-07-10 ENCOUNTER — Encounter: Payer: Self-pay | Admitting: Cardiology

## 2019-07-10 ENCOUNTER — Other Ambulatory Visit: Payer: Self-pay

## 2019-07-10 VITALS — BP 136/60 | HR 82 | Ht 69.0 in | Wt 188.4 lb

## 2019-07-10 DIAGNOSIS — I480 Paroxysmal atrial fibrillation: Secondary | ICD-10-CM | POA: Diagnosis not present

## 2019-07-10 DIAGNOSIS — I639 Cerebral infarction, unspecified: Secondary | ICD-10-CM | POA: Diagnosis not present

## 2019-07-10 MED ORDER — APIXABAN 5 MG PO TABS: 5.0000 mg | ORAL_TABLET | Freq: Two times a day (BID) | ORAL | 6 refills | Status: DC

## 2019-07-10 NOTE — Patient Instructions (Signed)
Medication Instructions:  Please discontinue your Plavix and start Eliquis 5 mg twice a day. Continue all other medications as listed.  *If you need a refill on your cardiac medications before your next appointment, please call your pharmacy*  You have been referred to [facility].  Their office will contact you to schedule an appointment.  Follow-Up: At [facility], you and your health needs are our priority.  As part of our continuing mission to provide you with exceptional heart care, we have created designated Provider Care Teams.  These Care Teams include your primary Cardiologist (physician) and Advanced Practice Providers (APPs -  Physician Assistants and Nurse Practitioners) who all work together to provide you with the care you need, when you need it.  We recommend signing up for the patient portal called "MyChart".  Sign up information is provided on this After Visit Summary.  MyChart is used to connect with patients for Virtual Visits (Telemedicine).  Patients are able to view lab/test results, encounter notes, upcoming appointments, etc.  Non-urgent messages can be sent to your provider as well.   To learn more about what you can do with MyChart, go to [email or web address].    Your next appointment:   6 month(s)  The format for your next appointment:   In Person  Provider:   Donato Schultz, MD   Thank you for choosing [facility]!!

## 2019-07-10 NOTE — Progress Notes (Signed)
Cardiology Office Note:    Date:  07/10/2019   ID:  Bruce Sanford, DOB [birth year], MRN [id number]  PCP:  Chesley Noon, MD  Cardiologist:  Candee Furbish, MD  Electrophysiologist:  None   Referring MD: Chesley Noon, MD     History of Present Illness:    Bruce Sanford is a 79 y.o. male here for evaluation of monitor to evaluate for atrial fibrillation following stroke.  On 07/04/2019 we had a critical cardiac event monitor statement receiving atrial fibrillation on 07/03/2019 afternoon.  He was not aware of any symptoms.  Dr. Johnsie Cancel who was Dr. Berneice Gandy the day here in the office was made aware.  Since he had a visit with me upcoming, deferred any management to this visit.  I personally reviewed the event monitor strips from 07/03/2019 which did show clear atrial fibrillation with normal ventricular response.  Asymptomatic.  At a subacute MCA infarct on MRI.  No residual neurologic deficits on exam.  He has hypertension hyperlipidemia tobacco use and alcohol use.  Plan was to proceed with aspirin and Plavix for 3 weeks following the stroke then Plavix monotherapy after that.  He is to follow-up with neurology soon.  Past Medical History:  Diagnosis Date  . Stroke [facility])     History reviewed. No pertinent surgical history.  Current Medications: Current Meds  Medication Sig  . Aspirin Buf,CaCarb-MgCarb-MgO, 81 MG TABS Take 81 mg by mouth daily.  Marland Kitchen atorvastatin (LIPITOR) 40 MG tablet Take 1 tablet (40 mg total) by mouth daily at 6 PM.  . Cholecalciferol 25 MCG (1000 UT) capsule Take 1,000 Units by mouth daily.  . Multiple Vitamin (MULTIVITAMIN) capsule Take 1 capsule by mouth daily.  . Omega-3 350 MG CPDR Take 3 capsules by mouth 2 (two) times daily.  . psyllium (METAMUCIL) 58.6 % packet Take 1 packet by mouth 2 (two) times daily.   . valsartan-hydrochlorothiazide (DIOVAN-HCT) 160-12.5 MG tablet Take 1 tablet by mouth daily.  . [DISCONTINUED] clopidogrel (PLAVIX) 75 MG tablet  Take 1 tablet (75 mg total) by mouth daily.     Allergies:   Patient has no allergy information on record.   Social History   Socioeconomic History  . Marital status: Married    Spouse name: Not on file  . Number of children: Not on file  . Years of education: Not on file  . Highest education level: Not on file  Occupational History  . Not on file  Tobacco Use  . Smoking status: Heavy Tobacco Smoker    Packs/day: 0.50    Types: Cigarettes  . Smokeless tobacco: Never Used  Substance and Sexual Activity  . Alcohol use: Not on file  . Drug use: Not on file  . Sexual activity: Not on file  Other Topics Concern  . Not on file  Social History Narrative  . Not on file   Social Determinants of Health   Financial Resource Strain:   . Difficulty of Paying Living Expenses: Not on file  Food Insecurity:   . Worried About Charity fundraiser in the Last Year: Not on file  . Ran Out of Food in the Last Year: Not on file  Transportation Needs:   . Lack of Transportation (Medical): Not on file  . Lack of Transportation (Non-Medical): Not on file  Physical Activity:   . Days of Exercise per Week: Not on file  . Minutes of Exercise per Session: Not on file  Stress:   . Feeling of Stress :  Not on file  Social Connections:   . Frequency of Communication with Friends and Family: Not on file  . Frequency of Social Gatherings with Friends and Family: Not on file  . Attends Religious Services: Not on file  . Active Member of Clubs or Organizations: Not on file  . Attends Banker Meetings: Not on file  . Marital Status: Not on file     Family History: The patient's family history is not on file.  No early family history of CAD  ROS:   Please see the history of present illness.    No fever chills nausea vomiting syncope bleeding all other systems reviewed and are negative.  EKGs/Labs/Other Studies Reviewed:    The following studies were reviewed today: Prior hospital  records echocardiogram MRI  EKG: Sinus rhythm  Recent Labs: 06/15/2019: ALT 34; BUN 11; Creatinine, Ser 1.16; Hemoglobin 16.2; Platelets 240; Potassium 4.2; Sodium 140  Recent Lipid Panel    Component Value Date/Time   CHOL 135 06/15/2019 1030   TRIG 85 06/15/2019 1030   HDL 48 06/15/2019 1030   CHOLHDL 2.8 06/15/2019 1030   VLDL 17 06/15/2019 1030   LDLCALC 70 06/15/2019 1030    Physical Exam:    VS:  BP 136/60   Pulse 82   Ht 5\' 9"  (1.753 m)   Wt 188 lb 6.4 oz (85.5 kg)   SpO2 97%   BMI 27.82 kg/m     Wt Readings from Last 3 Encounters:  07/10/19 188 lb 6.4 oz (85.5 kg)  06/15/19 190 lb (86.2 kg)     GEN:  Well nourished, well developed in no acute distress HEENT: Normal NECK: No JVD; No carotid bruits LYMPHATICS: No lymphadenopathy CARDIAC: RRR, no murmurs, rubs, gallops RESPIRATORY:  Clear to auscultation without rales, wheezing or rhonchi  ABDOMEN: Soft, non-tender, non-distended MUSCULOSKELETAL:  No edema; No deformity  SKIN: Warm and dry NEUROLOGIC:  Alert and oriented x 3 PSYCHIATRIC:  Normal affect   ASSESSMENT:    1. PAF (paroxysmal atrial fibrillation) (HCC)   2. Cerebrovascular accident (CVA), unspecified mechanism (HCC)    PLAN:    In order of problems listed above:  Paroxysmal atrial fibrillation -Discovered on event monitor. -We will start Eliquis 5 mg twice a day -We will discontinue his Plavix.  Continue aspirin until visit with neurology.  At that time, likely be able to discontinue.  He is already seen Dr. 08/13/19, neurologist in the hospital setting.  He is requesting to see him or one of his partners back here.  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  Starting Eliquis especially now that atrial fibrillation has been detected.  Tobacco -Bupropion has been prescribed.  Dr. Pearlean Brownie   Continue to curb alcohol use.  Continue with high intensity statin.    Medication Adjustments/Labs and Tests Ordered: Current medicines are reviewed at length  with the patient today.  Concerns regarding medicines are outlined above.  Orders Placed This Encounter  Procedures  . Ambulatory referral to Neurology   Meds ordered this encounter  Medications  . apixaban (ELIQUIS) 5 MG TABS tablet    Sig: Take 1 tablet (5 mg total) by mouth 2 (two) times daily.    Dispense:  60 tablet    Refill:  6    Patient Instructions  Medication Instructions:  Please discontinue your Plavix and start Eliquis 5 mg twice a day. Continue all other medications as listed.  *If you need a refill on your cardiac medications before your next appointment, please  call your pharmacy*  You have been referred to [facility].  Their office will contact you to schedule an appointment.  Follow-Up: At [facility], you and your health needs are our priority.  As part of our continuing mission to provide you with exceptional heart care, we have created designated Provider Care Teams.  These Care Teams include your primary Cardiologist (physician) and Advanced Practice Providers (APPs -  Physician Assistants and Nurse Practitioners) who all work together to provide you with the care you need, when you need it.  We recommend signing up for the patient portal called "MyChart".  Sign up information is provided on this After Visit Summary.  MyChart is used to connect with patients for Virtual Visits (Telemedicine).  Patients are able to view lab/test results, encounter notes, upcoming appointments, etc.  Non-urgent messages can be sent to your provider as well.   To learn more about what you can do with MyChart, go to [email or web address].    Your next appointment:   6 month(s)  The format for your next appointment:   In Person  Provider:   Donato Schultz, MD   Thank you for choosing [facility]!!         Signed, Donato Schultz, MD  07/10/2019 5:12 PM    [facility]

## 2019-07-12 ENCOUNTER — Telehealth: Payer: Self-pay | Admitting: Cardiology

## 2019-07-12 NOTE — Telephone Encounter (Signed)
New Message  [employer] called because the patient has a serious EKG report

## 2019-07-12 NOTE — Telephone Encounter (Signed)
Follow up  Pt called and would like to tell Nurse Dewayne Hatch that he just remembered during his EKG report at 12:50 he was doing projects in his garage that last for 15 mins and it includes some heave lifting.

## 2019-07-12 NOTE — Telephone Encounter (Signed)
Per the DOD, Dr. Ladona Ridgel pt is in Afib and will continue to monitor.. will have scanned in for Dr. Anne Fu review.   Pt advised to be sure he continues to take his Eliquis daily.

## 2019-07-12 NOTE — Telephone Encounter (Signed)
Received call from [facility] with [employer].... she is reporting a Serious EKG from 12:50 pm this afternoon.Bruce Sanford AFIB with RVR 162-180. Pt was in Aflutter this morning with a normal VR. This was a triggered event by the pt.   Called the pt and he reports that he did not remember triggering the monitor and he has been feeling well all day today. He denies dizziness, palpitations, sob. Pt has been taking his ASA and Eliquis with no missed doses.   Will get the strips and forward to Dr. Anne Fu for review.    Addendum: Pt called back to report that he was doing some heavy lifting during the time in his garage.

## 2019-07-23 ENCOUNTER — Telehealth: Payer: Self-pay

## 2019-07-23 NOTE — Telephone Encounter (Signed)
Called patient about his monitor. Monitor report was faxed to the office. Time of occurence was 07/22/19 at 10:19 pm. Monitor showed PAF, per DOD, Dr. Katrinka Blazing. Patient stated he was sleeping at the time and did not have any symptoms at the time. Patient is on eliquis 5 mg BID. Will send message to Dr. Anne Fu, so he is aware.

## 2019-07-30 ENCOUNTER — Telehealth: Payer: Self-pay

## 2019-07-30 NOTE — Telephone Encounter (Signed)
Report received form [employer] regarding patients monitor showing he was in and out of Atrial Fibrillation for several hours approximately 9a-11a on 07/29/19. Pt states he was doing yard work most of the day but was asymptomatic and had no concerns. Advised pt to give [place] a call with any new symptoms.

## 2019-07-31 ENCOUNTER — Telehealth: Payer: Self-pay | Admitting: *Deleted

## 2019-07-31 NOTE — Telephone Encounter (Signed)
[employer] faxed over another auto-triggered event on this pts monitor, from 07/30/19 at 2:15 pm CST showing that the pt had afib with RVR sustained w/PVCs for 1 minute total. HR on monitor during this time was 150 bpm. Called the pt to inquire if he had any symptoms during this recorded event, and pt states he was completely asymptomatic and working at his desk from home.  Pt states he has had no symptoms at all and is feeling great today.  Pt states he is in full compliance with taking all his prescribed cardiac meds.  Informed the pt that Dr. Anne Fu was aware of his event monitor recording that was handled by our other triage RN yesterday, and stated he was aware of afib, discussed in clinic.   Pt reports that he will discontinue wearing his monitor starting tomorrow, for he will be at 30 days by then.  Pt states he was wanting to know if he needs to schedule an appt with Dr. Anne Fu to further discuss his entire event monitor once he has reviewed it, or keep his follow-up as planned for 6 months out, around August 2021.  Informed the pt that I will route this message to Dr. Anne Fu and his RN to further advise on his follow-up, upon return to the office.   Also informed the pt that I will go and show our DOD Dr. Eldridge Dace to review his monitor recording from yesterday, being Dr. Anne Fu is out of the office today.  Informed the pt that this message will get routed to Dr. Anne Fu and Elita Quick RN, for their review and follow-up.  Pt verbalized understanding and agrees with this plan.

## 2019-07-31 NOTE — Telephone Encounter (Signed)
Monitor showed to DOD Dr. Eldridge Dace, and no changes to be made, continue to monitor and route this message to Dr. Anne Fu and [facility] RN for further review and follow-up.  Will have monitor scanned into the system today.

## 2019-07-31 NOTE — Telephone Encounter (Signed)
Aware of AFIB.  Discussed in clinic Donato Schultz, MD

## 2019-07-31 NOTE — Telephone Encounter (Signed)
See open note with this documentation attached about event monitor.  Note was started on 3/22.

## 2019-08-01 ENCOUNTER — Ambulatory Visit: Payer: Managed Care, Other (non HMO) | Admitting: Cardiology

## 2019-08-20 ENCOUNTER — Other Ambulatory Visit: Payer: Self-pay | Admitting: Nurse Practitioner

## 2019-08-20 ENCOUNTER — Telehealth: Payer: Self-pay

## 2019-08-20 DIAGNOSIS — I4891 Unspecified atrial fibrillation: Secondary | ICD-10-CM

## 2019-08-20 DIAGNOSIS — I639 Cerebral infarction, unspecified: Secondary | ICD-10-CM

## 2019-08-20 NOTE — Telephone Encounter (Signed)
-----   Message from Mark C Skains, MD sent at 08/20/2019  3:38 PM EDT -----  Paroxysmal atrial fibrillation detected, representing 46% of the monitoring period.  Average heart rate with atrial fibrillation was 99 bpm, maximum 200 bpm, minimum 53 bpm.  No significant pauses  History of prior stroke.-Currently taking Eliquis.  Lets go ahead and start Toprol-XL 50 mg once a day.    Mark Skains, MD 

## 2019-08-20 NOTE — Telephone Encounter (Signed)
lpmtcb 4/12 

## 2019-08-21 ENCOUNTER — Other Ambulatory Visit: Payer: Self-pay | Admitting: *Deleted

## 2019-08-21 MED ORDER — METOPROLOL SUCCINATE ER 50 MG PO TB24: 50.0000 mg | ORAL_TABLET | Freq: Every day | ORAL | 3 refills | Status: AC

## 2019-08-21 NOTE — Telephone Encounter (Signed)
PT AWARE OF MONITOR RESULTS AND SCRIPT SENT TO LISTED PHARMACY .Zack Seal

## 2019-08-21 NOTE — Telephone Encounter (Signed)
-----   Message from Jake Bathe, MD sent at 08/20/2019  3:38 PM EDT -----  Paroxysmal atrial fibrillation detected, representing 46% of the monitoring period.  Average heart rate with atrial fibrillation was 99 bpm, maximum 200 bpm, minimum 53 bpm.  No significant pauses  History of prior stroke.-Currently taking Eliquis.  Lets go ahead and start Toprol-XL 50 mg once a day.    Donato Schultz, MD

## 2019-08-28 ENCOUNTER — Ambulatory Visit (INDEPENDENT_AMBULATORY_CARE_PROVIDER_SITE_OTHER): Payer: Managed Care, Other (non HMO) | Admitting: Neurology

## 2019-08-28 ENCOUNTER — Encounter: Payer: Self-pay | Admitting: Neurology

## 2019-08-28 ENCOUNTER — Other Ambulatory Visit: Payer: Self-pay

## 2019-08-28 VITALS — BP 142/81 | Temp 97.6°F | Ht 69.0 in | Wt 181.0 lb

## 2019-08-28 DIAGNOSIS — I63412 Cerebral infarction due to embolism of left middle cerebral artery: Secondary | ICD-10-CM

## 2019-08-28 NOTE — Patient Instructions (Signed)
I had a long d/w patient about his recent stroke,atrial fibrillation, risk for recurrent stroke/TIAs, personally independently reviewed imaging studies and stroke evaluation results and answered questions.Continue Eliquis (apixaban) daily  for secondary stroke prevention and maintain strict control of hypertension with blood pressure goal below 130/90, diabetes with hemoglobin A1c goal below 6.5% and lipids with LDL cholesterol goal below 70 mg/dL. I also advised the patient to eat a healthy diet with plenty of whole grains, cereals, fruits and vegetables, exercise regularly and maintain ideal body weight Followup in the future with my nurse practitioner Shanda Bumps in 3 months or call earlier if necessary.  Stroke Prevention Some medical conditions and behaviors are associated with a higher chance of having a stroke. You can help prevent a stroke by making nutrition, lifestyle, and other changes, including managing any medical conditions you may have. What nutrition changes can be made?   Eat healthy foods. You can do this by: ? Choosing foods high in fiber, such as fresh fruits and vegetables and whole grains. ? Eating at least 5 or more servings of fruits and vegetables a day. Try to fill half of your plate at each meal with fruits and vegetables. ? Choosing lean protein foods, such as lean cuts of meat, poultry without skin, fish, tofu, beans, and nuts. ? Eating low-fat dairy products. ? Avoiding foods that are high in salt (sodium). This can help lower blood pressure. ? Avoiding foods that have saturated fat, trans fat, and cholesterol. This can help prevent high cholesterol. ? Avoiding processed and premade foods.  Follow your health care provider's specific guidelines for losing weight, controlling high blood pressure (hypertension), lowering high cholesterol, and managing diabetes. These may include: ? Reducing your daily calorie intake. ? Limiting your daily sodium intake to 1,500 milligrams  (mg). ? Using only healthy fats for cooking, such as olive oil, canola oil, or sunflower oil. ? Counting your daily carbohydrate intake. What lifestyle changes can be made?  Maintain a healthy weight. Talk to your health care provider about your ideal weight.  Get at least 30 minutes of moderate physical activity at least 5 days a week. Moderate activity includes brisk walking, biking, and swimming.  Do not use any products that contain nicotine or tobacco, such as cigarettes and e-cigarettes. If you need help quitting, ask your health care provider. It may also be helpful to avoid exposure to secondhand smoke.  Limit alcohol intake to no more than 1 drink a day for nonpregnant women and 2 drinks a day for men. One drink equals 12 oz of beer, 5 oz of wine, or 1 oz of hard liquor.  Stop any illegal drug use.  Avoid taking birth control pills. Talk to your health care provider about the risks of taking birth control pills if: ? You are over 68 years old. ? You smoke. ? You get migraines. ? You have ever had a blood clot. What other changes can be made?  Manage your cholesterol levels. ? Eating a healthy diet is important for preventing high cholesterol. If cholesterol cannot be managed through diet alone, you may also need to take medicines. ? Take any prescribed medicines to control your cholesterol as told by your health care provider.  Manage your diabetes. ? Eating a healthy diet and exercising regularly are important parts of managing your blood sugar. If your blood sugar cannot be managed through diet and exercise, you may need to take medicines. ? Take any prescribed medicines to control your diabetes as  told by your health care provider.  Control your hypertension. ? To reduce your risk of stroke, try to keep your blood pressure below 130/80. ? Eating a healthy diet and exercising regularly are an important part of controlling your blood pressure. If your blood pressure cannot  be managed through diet and exercise, you may need to take medicines. ? Take any prescribed medicines to control hypertension as told by your health care provider. ? Ask your health care provider if you should monitor your blood pressure at home. ? Have your blood pressure checked every year, even if your blood pressure is normal. Blood pressure increases with age and some medical conditions.  Get evaluated for sleep disorders (sleep apnea). Talk to your health care provider about getting a sleep evaluation if you snore a lot or have excessive sleepiness.  Take over-the-counter and prescription medicines only as told by your health care provider. Aspirin or blood thinners (antiplatelets or anticoagulants) may be recommended to reduce your risk of forming blood clots that can lead to stroke.  Make sure that any other medical conditions you have, such as atrial fibrillation or atherosclerosis, are managed. What are the warning signs of a stroke? The warning signs of a stroke can be easily remembered as BEFAST.  B is for balance. Signs include: ? Dizziness. ? Loss of balance or coordination. ? Sudden trouble walking.  E is for eyes. Signs include: ? A sudden change in vision. ? Trouble seeing.  F is for face. Signs include: ? Sudden weakness or numbness of the face. ? The face or eyelid drooping to one side.  A is for arms. Signs include: ? Sudden weakness or numbness of the arm, usually on one side of the body.  S is for speech. Signs include: ? Trouble speaking (aphasia). ? Trouble understanding.  T is for time. ? These symptoms may represent a serious problem that is an emergency. Do not wait to see if the symptoms will go away. Get medical help right away. Call your local emergency services (911 in the [place] Do not drive yourself to the hospital.  Other signs of stroke may include: ? A sudden, severe headache with no known cause. ? Nausea or vomiting. ? Seizure. Where to  find more information For more information, visit:  American Stroke Association: www.strokeassociation.org  National Stroke Association: www.stroke.org Summary  You can prevent a stroke by eating healthy, exercising, not smoking, limiting alcohol intake, and managing any medical conditions you may have.  Do not use any products that contain nicotine or tobacco, such as cigarettes and e-cigarettes. If you need help quitting, ask your health care provider. It may also be helpful to avoid exposure to secondhand smoke.  Remember BEFAST for warning signs of stroke. Get help right away if you or a loved one has any of these signs. This information is not intended to replace advice given to you by your health care provider. Make sure you discuss any questions you have with your health care provider. Document Revised: 04/08/2017 Document Reviewed: 06/01/2016 Elsevier Patient Education  2020 [employer].

## 2019-08-28 NOTE — Progress Notes (Signed)
[facility] [address] [place]. [place] [address] [phone]       OFFICE FOLLOW-UP NOTE  Mr. Alvah Lagrow Date of Birth:  02-Mar-1941 Medical Record Number:  [id number]   HPI: Mr. Cen is a 79 year old Caucasian male seen today for initial office follow-up visit following hospital consultation for stroke.  History is obtained from the patient, review of electronic medical records and I personally reviewed imaging films in PACS.  Mr. Szymborski is a 79 year old male with past medical history of hypertension only who was admitted to [facility] on 06/15/2019 after he had an outpatient MRI done at [facility] imaging on 06/12/2019 which showed subacute left MCA infarct.  The patient actually had a sudden change in his behavior and personality lasting 2 days which was noted by his wife to mention this to the primary care physician who ordered an outpatient MRI for evaluation.  Patient also had some halting speech and word finding difficulties while his behavior was change in that.  By the time he was admitted he had no symptoms or deficits on exam.  MRI scan of the brain showed subacute infarct involving left parietal and frontal lobes and MRA of the brain showed no significant vascular stenosis or occlusion.  Carotid ultrasound showed no significant extracranial stenosis.  2D echo showed normal ejection fraction without cardiac source of embolism.  LDL cholesterol was borderline at 70 mg percent and hemoglobin A1c is 5.5.  Patient denies any history of atrial fibrillation palpitation or cardiac issues.  He had outpatient 30-day heart monitor which on 08/20/2019 showed several episodes of paroxysmal atrial fibrillation.  He has been started on Eliquis since then and is tolerating it well with only minor bruising and no bleeding.  Is also been started on Toprol-XL.  Patient states he feels quite good he has no complaints and is fully back to his baseline.  ROS:   14 system review  of systems is positive for minor bruising only and no other complaints  PMH:  Past Medical History:  Diagnosis Date  . Atrial fibrillation (HCC)   . Hypertension   . Stroke (HCC)   . Vision abnormalities    cataracts    Social History:  Social History   Socioeconomic History  . Marital status: Married    Spouse name: Not on file  . Number of children: Not on file  . Years of education: Not on file  . Highest education level: Not on file  Occupational History  . Not on file  Tobacco Use  . Smoking status: Heavy Tobacco Smoker    Packs/day: 0.50    Types: Cigarettes  . Smokeless tobacco: Never Used  Substance and Sexual Activity  . Alcohol use: Yes    Comment: gin  three small cup per day  . Drug use: Not on file  . Sexual activity: Not on file  Other Topics Concern  . Not on file  Social History Narrative  . Not on file   Social Determinants of Health   Financial Resource Strain:   . Difficulty of Paying Living Expenses:   Food Insecurity:   . Worried About Programme researcher, broadcasting/film/video in the Last Year:   . Barista in the Last Year:   Transportation Needs:   . Freight forwarder (Medical):   Marland Kitchen Lack of Transportation (Non-Medical):   Physical Activity:   . Days of Exercise per Week:   . Minutes of Exercise per Session:   Stress:   .  Feeling of Stress :   Social Connections:   . Frequency of Communication with Friends and Family:   . Frequency of Social Gatherings with Friends and Family:   . Attends Religious Services:   . Active Member of Clubs or Organizations:   . Attends Banker Meetings:   Marland Kitchen Marital Status:   Intimate Partner Violence:   . Fear of Current or Ex-Partner:   . Emotionally Abused:   Marland Kitchen Physically Abused:   . Sexually Abused:     Medications:   Current Outpatient Medications on File Prior to Visit  Medication Sig Dispense Refill  . apixaban (ELIQUIS) 5 MG TABS tablet Take 1 tablet (5 mg total) by mouth 2 (two) times  daily. 60 tablet 6  . Aspirin Buf,CaCarb-MgCarb-MgO, 81 MG TABS Take 81 mg by mouth daily.    Marland Kitchen atorvastatin (LIPITOR) 40 MG tablet Take 1 tablet (40 mg total) by mouth daily at 6 PM. 30 tablet 0  . Cholecalciferol 25 MCG (1000 UT) capsule Take 1,000 Units by mouth daily.    . metoprolol succinate (TOPROL-XL) 50 MG 24 hr tablet Take 1 tablet (50 mg total) by mouth daily. Take with or immediately following a meal. 90 tablet 3  . Multiple Vitamin (MULTIVITAMIN) capsule Take 1 capsule by mouth daily.    . Omega-3 350 MG CPDR Take 3 capsules by mouth 2 (two) times daily.    . psyllium (METAMUCIL) 58.6 % packet Take 1 packet by mouth 2 (two) times daily.     . valsartan-hydrochlorothiazide (DIOVAN-HCT) 160-12.5 MG tablet Take 1 tablet by mouth daily.     No current facility-administered medications on file prior to visit.    Allergies:  Not on File  Physical Exam General: Mildly obese elderly Caucasian male, seated, in no evident distress Head: head normocephalic and atraumatic.  Neck: supple with no carotid or supraclavicular bruits Cardiovascular: regular rate and rhythm, no murmurs Musculoskeletal: no deformity Skin:  no rash/petichiae Vascular:  Normal pulses all extremities Vitals:   08/28/19 1306  BP: (!) 142/81  Temp: 97.6 F (36.4 C)   Neurologic Exam Mental Status: Awake and fully alert. Oriented to place and time. Recent and remote memory intact. Attention span, concentration and fund of knowledge appropriate. Mood and affect appropriate.  Cranial Nerves: Fundoscopic exam reveals sharp disc margins. Pupils equal, briskly reactive to light. Extraocular movements full without nystagmus. Visual fields full to confrontation. Hearing intact. Facial sensation intact. Face, tongue, palate moves normally and symmetrically.  Motor: Normal bulk and tone. Normal strength in all tested extremity muscles. Sensory.: intact to touch ,pinprick .position and vibratory sensation.  Coordination:  Rapid alternating movements normal in all extremities. Finger-to-nose and heel-to-shin performed accurately bilaterally. Gait and Station: Arises from chair without difficulty. Stance is normal. Gait demonstrates normal stride length and balance . Able to heel, toe and tandem walk with only slight t difficulty.  Reflexes: 1+ and symmetric. Toes downgoing.   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 fib on cardiac monitoring.  Vascular risk factors of hypertension, hyperlipidemia, atrial fibrillation and age he is doing extremely well.     PLAN: I had a long d/w patient about his recent stroke,atrial fibrillation, risk for recurrent stroke/TIAs, personally independently reviewed imaging studies and stroke evaluation results and answered questions.Continue Eliquis (apixaban) daily  for secondary stroke prevention and maintain strict control of hypertension with  blood pressure goal below 130/90, diabetes with hemoglobin A1c goal below 6.5% and lipids with LDL cholesterol goal below 70 mg/dL. I also advised the patient to eat a healthy diet with plenty of whole grains, cereals, fruits and vegetables, exercise regularly and maintain ideal body weight Followup in the future with my nurse practitioner Janett Billow in 3 months or call earlier if necessary. Greater than 50% of time during this 25 minute visit was spent on counseling,explanation of diagnosis, planning of further management, discussion with patient and family and coordination of care Antony Contras, MD  [facility] [address] [place], [place] [address]  Phone [phone] Fax [phone] Note: This document was prepared with digital dictation and possible smart phrase technology. Any transcriptional errors that result from this process are unintentional

## 2019-08-29 NOTE — Progress Notes (Signed)
Thank you for excellent follow-up  and prompt care

## 2019-09-07 ENCOUNTER — Telehealth: Payer: Self-pay

## 2019-09-07 NOTE — Telephone Encounter (Signed)
**Note De-Identified Bessy Reaney Obfuscation** Following message received from [facility]: Martin Majestic Key: [id number] - PA Case ID: [id number] Outcome  Approved today  Case Id: [id number] Status:Approved Type:Prior Auth;Coverage Start Date:09/07/2019;Coverage End Date:09/06/2020;  Drug Eliquis 5 MG tablets  Form [employer] Electronic PA Form (2017 NCPDP)  Original Claim Info75   I have advised [facility] of this approval.

## 2019-09-07 NOTE — Telephone Encounter (Signed)
**Note De-Identified Abimbola Aki Obfuscation** I started an Eliquis PA through covermymeds. Key: [id number]

## 2019-11-27 ENCOUNTER — Ambulatory Visit: Payer: Managed Care, Other (non HMO) | Admitting: Adult Health

## 2020-03-03 ENCOUNTER — Other Ambulatory Visit: Payer: Self-pay | Admitting: Cardiology

## 2020-03-03 NOTE — Telephone Encounter (Signed)
Age 79, weight 82kg, SCr 1.16 on 06/15/19 Last OV March 2021, afib indication

## 2021-01-13 IMAGING — MR MR HEAD W/O CM
7 of 11 series · 25 of 48 positions shown · non-contrast
Comparison: None.

CLINICAL DATA: Recent episode of confusion

EXAM:
MRI HEAD WITHOUT CONTRAST
MRA HEAD WITHOUT CONTRAST
TECHNIQUE: Multiplanar, multiecho pulse sequences of the brain and surrounding
structures were obtained without intravenous contrast. Angiographic
images of the head were obtained using MRA technique without
contrast.

[Series 3: DWI · axial · 3.0mm · 0.94mm/px · z∈[-98,+48]mm · 7 of 100 slices shown (1 of 2)]
[im 1/100]
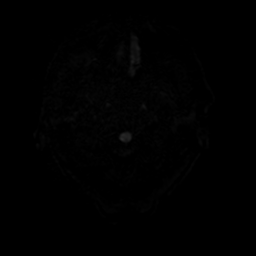
[im 17/100]
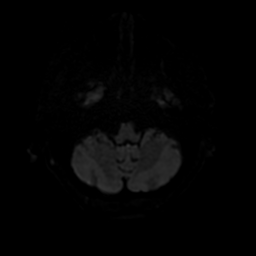
[im 34/100]
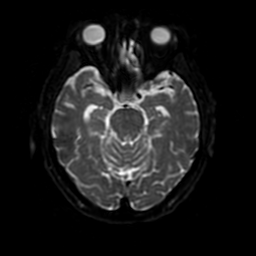
[im 50/100]
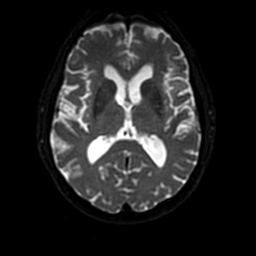
[im 67/100]
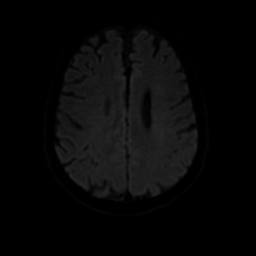
[im 83/100]
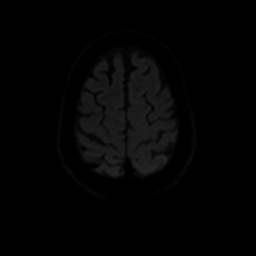
[im 100/100]
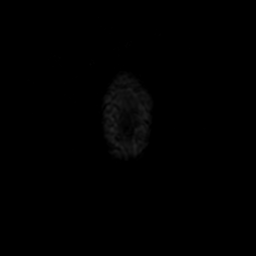

[Series 4: DWI · coronal · 4.0mm · 0.94mm/px · 6 of 78 slices shown (2 of 2)]
[im 1/78]
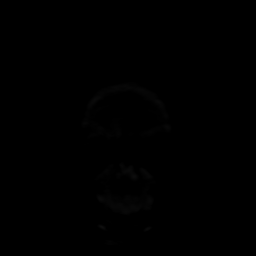
[im 16/78]
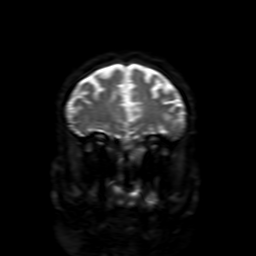
[im 31/78]
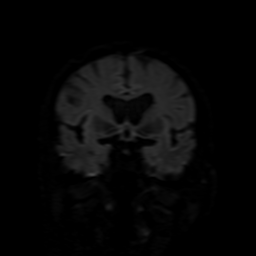
[im 47/78]
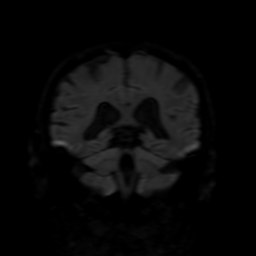
[im 62/78]
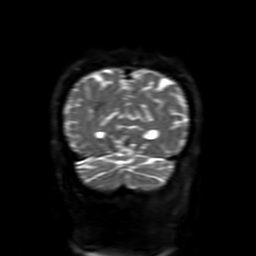
[im 78/78]
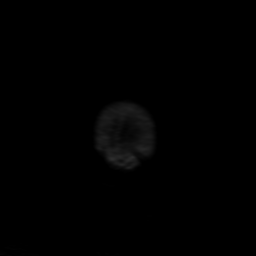

[Series 5: FLAIR · sagittal · 5.0mm · 0.23mm/px · 2 of 28 slices shown (1 of 2)]
[im 1/28]
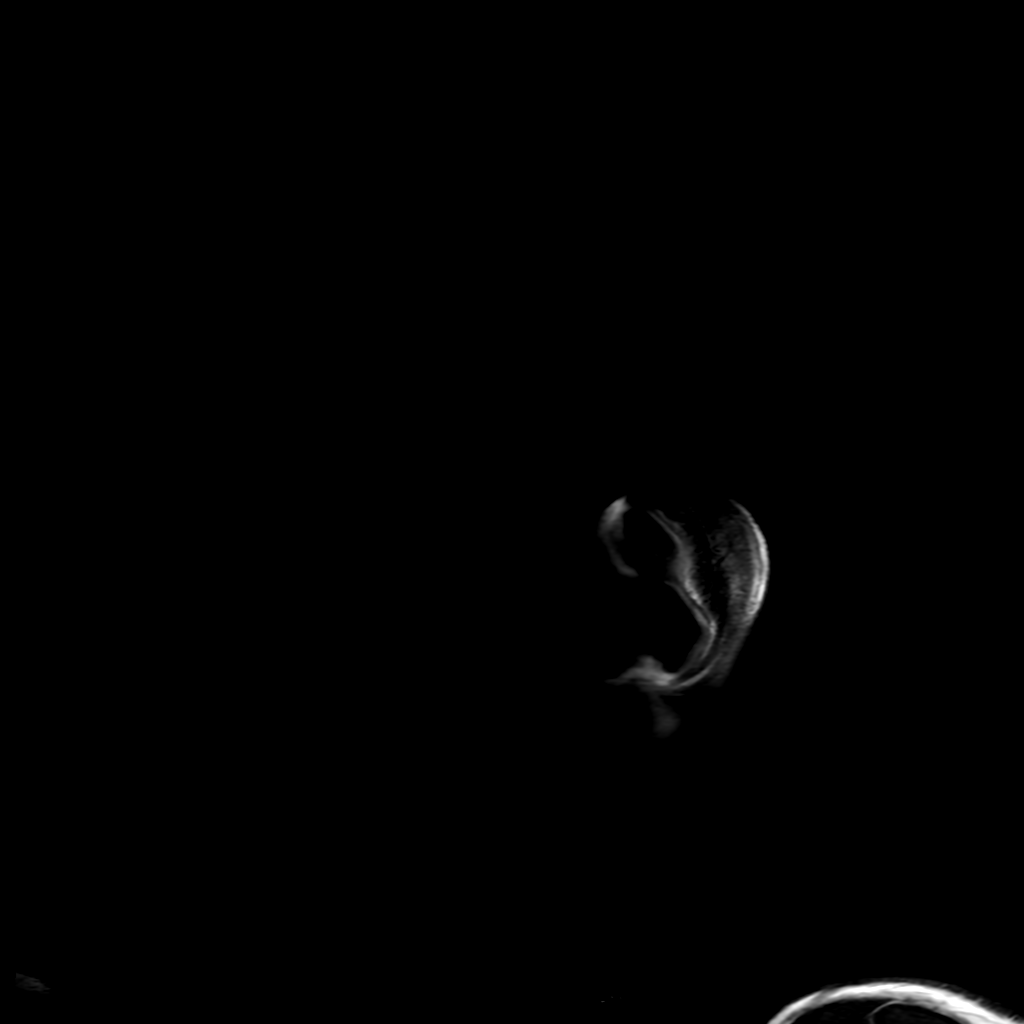
[im 28/28]
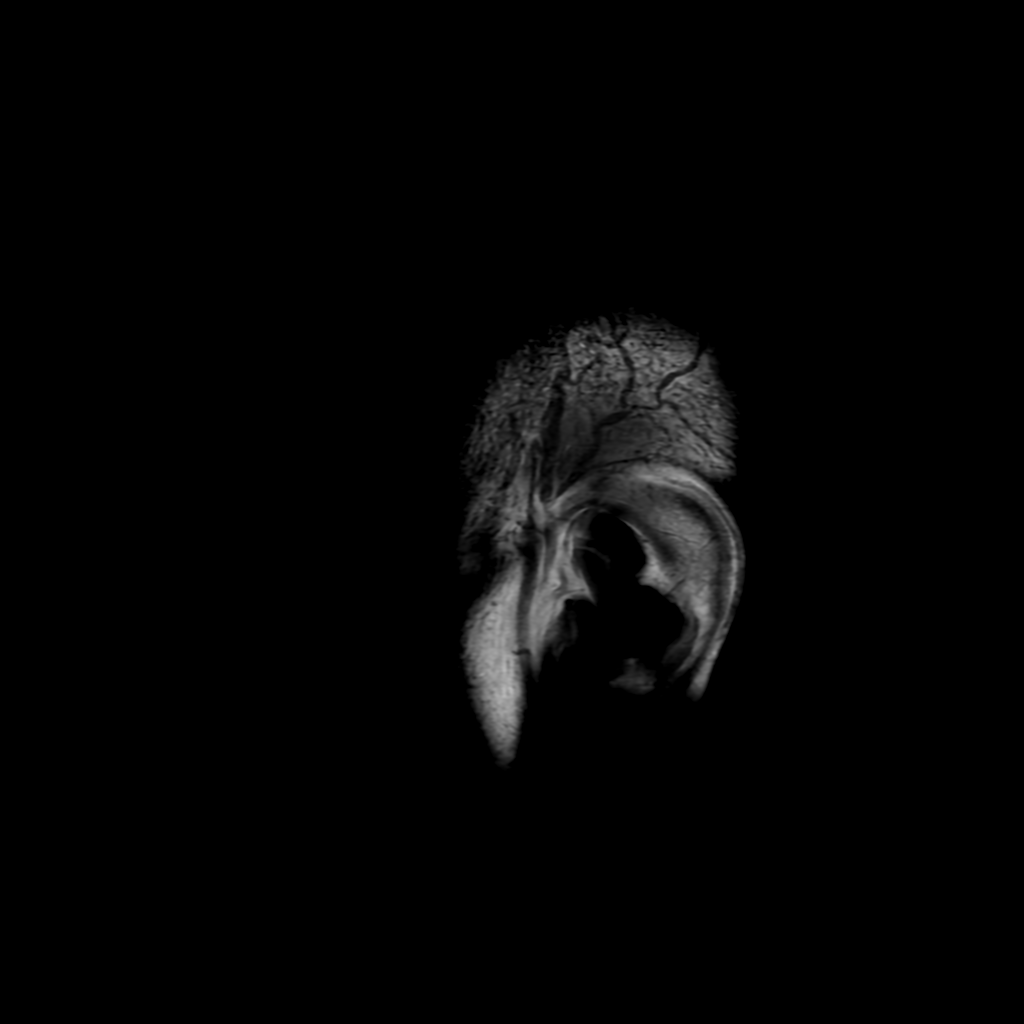

[Series 6: T2 · axial · 5.0mm · 0.23mm/px · 1 of 27 slices shown]
[im 1/27]
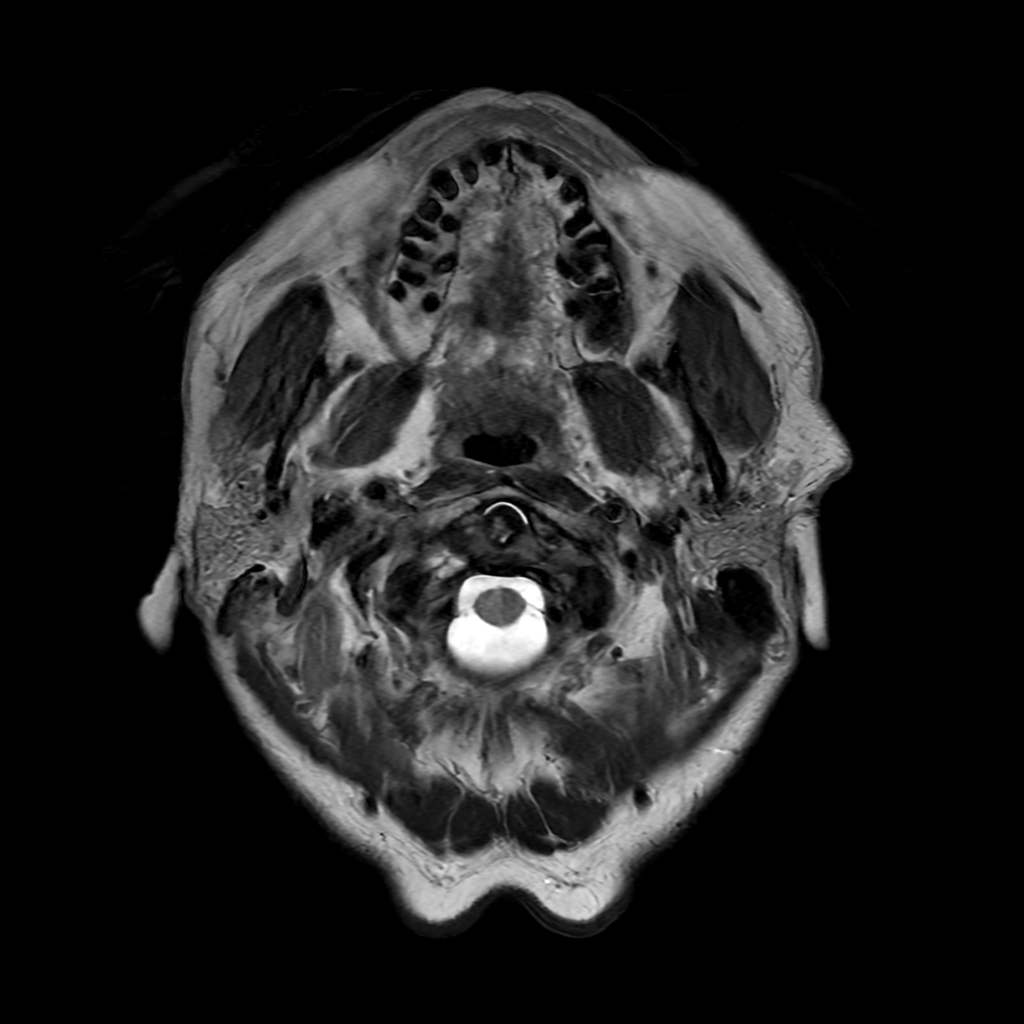

[Series 7: FLAIR · axial · 3.0mm · 0.45mm/px · z∈[-109,+38]mm · 2 of 26 slices shown (2 of 2)]
[im 1/26]
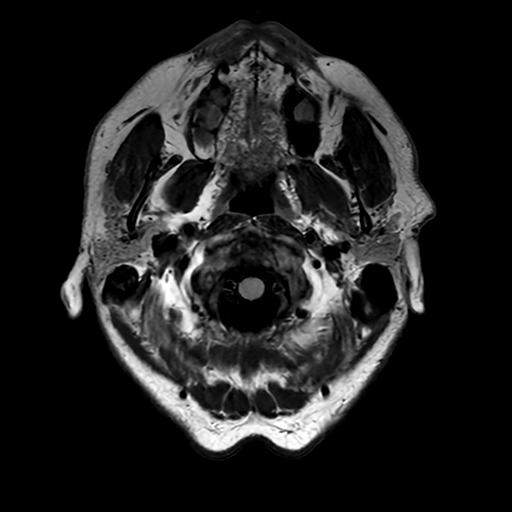
[im 26/26]
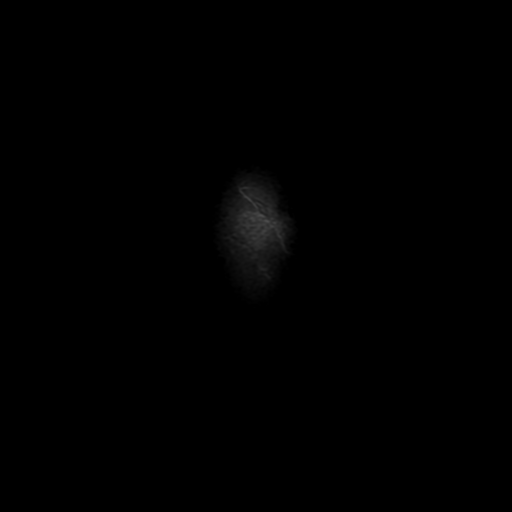

[Series 350: ADC · axial · 3.0mm · 0.94mm/px · z∈[-98,+48]mm · 4 of 50 slices shown (1 of 2)]
[im 1/50]
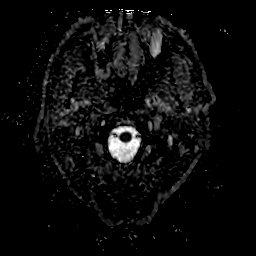
[im 17/50]
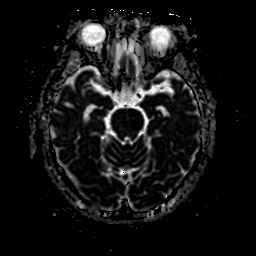
[im 33/50]
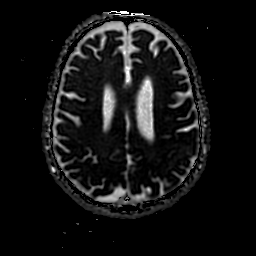
[im 50/50]
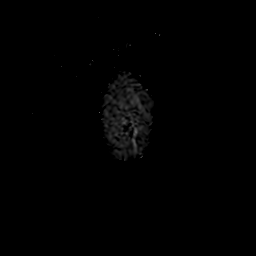

[Series 450: ADC · coronal · 4.0mm · 0.94mm/px · 3 of 39 slices shown (2 of 2)]
[im 1/39]
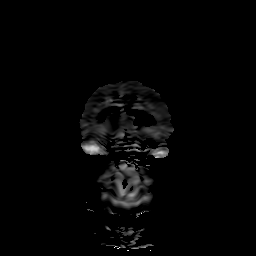
[im 20/39]
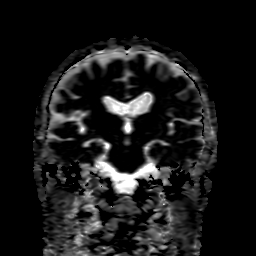
[im 39/39]
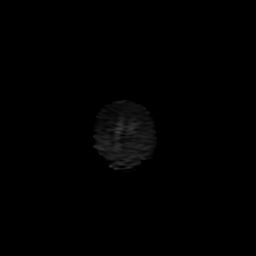

[25 of 48 positions shown; findings below may reference images not displayed]

FINDINGS: MRI HEAD FINDINGS

Brain: There is no acute infarction. Minimal patchy diffusion
hyperintensity is present in the left frontal and parietal lobes as
well as the anterior insula. There is corresponding T2
hyperintensity. Minimal patchy T2 hyperintensity in the
supratentorial white matter is nonspecific and may reflect minor
chronic microvascular ischemic changes. No evidence of hemorrhage.
There is no intracranial mass, mass effect, or edema. There is no
extra-axial fluid collection.

Vascular: Major vessel flow voids at the skull base are preserved.

Skull and upper cervical spine: Normal marrow signal is preserved.

Sinuses/Orbits: Mild mucosal thickening.  Orbits are unremarkable.

Other: Sella is unremarkable.  Mastoid air cells are clear.

MRA HEAD FINDINGS

Intracranial internal carotid arteries are patent. Middle and
anterior cerebral arteries are patent. Intracranial vertebral
arteries, basilar artery, posterior cerebral arteries are patent.
There is no significant stenosis or aneurysm.
IMPRESSION: Subacute left MCA territory infarcts involving frontal and parietal
lobes as well as anterior insula. No evidence of hemorrhage.

No proximal intracranial vessel occlusion or significant stenosis.

## 2023-06-12 ENCOUNTER — Emergency Department (HOSPITAL_COMMUNITY)
Admission: EM | Admit: 2023-06-12 | Discharge: 2023-06-12 | Discharge status: Against Medical Advice | Payer: Medicare HMO | Attending: Emergency Medicine | Admitting: Emergency Medicine

## 2023-06-12 DIAGNOSIS — Z1152 Encounter for screening for COVID-19: Secondary | ICD-10-CM | POA: Insufficient documentation

## 2023-06-12 DIAGNOSIS — S301XXA Contusion of abdominal wall, initial encounter: Secondary | ICD-10-CM | POA: Diagnosis not present

## 2023-06-12 DIAGNOSIS — G4751 Confusional arousals: Secondary | ICD-10-CM | POA: Insufficient documentation

## 2023-06-12 DIAGNOSIS — Z5321 Procedure and treatment not carried out due to patient leaving prior to being seen by health care provider: Secondary | ICD-10-CM | POA: Insufficient documentation

## 2023-06-12 DIAGNOSIS — X58XXXA Exposure to other specified factors, initial encounter: Secondary | ICD-10-CM | POA: Diagnosis not present

## 2023-06-12 DIAGNOSIS — R410 Disorientation, unspecified: Secondary | ICD-10-CM | POA: Diagnosis not present

## 2023-06-12 DIAGNOSIS — R531 Weakness: Secondary | ICD-10-CM | POA: Diagnosis present

## 2023-06-12 LAB — COMPREHENSIVE METABOLIC PANEL
ALT: 25 U/L (ref 0–44)
AST: 34 U/L (ref 15–41)
Albumin: 3.3 g/dL — ABNORMAL LOW (ref 3.5–5.0)
Alkaline Phosphatase: 67 U/L (ref 38–126)
Anion gap: 13 (ref 5–15)
BUN: 13 mg/dL (ref 8–23)
CO2: 29 mmol/L (ref 22–32)
Calcium: 9.2 mg/dL (ref 8.9–10.3)
Chloride: 98 mmol/L (ref 98–111)
Creatinine, Ser: 1.24 mg/dL (ref 0.61–1.24)
GFR, Estimated: 58 mL/min — ABNORMAL LOW (ref >60)
Glucose, Bld: 129 mg/dL — ABNORMAL HIGH (ref 70–99)
Potassium: 2.9 mmol/L — ABNORMAL LOW (ref 3.5–5.1)
Sodium: 140 mmol/L (ref 135–145)
Total Bilirubin: 1.9 mg/dL — ABNORMAL HIGH (ref 0.0–1.2)
Total Protein: 7.3 g/dL (ref 6.5–8.1)

## 2023-06-12 LAB — CBC WITH DIFFERENTIAL/PLATELET
Abs Immature Granulocytes: 0.08 10*3/uL — ABNORMAL HIGH (ref 0.00–0.07)
Basophils Absolute: 0.1 10*3/uL (ref 0.0–0.1)
Basophils Relative: 0 %
Eosinophils Absolute: 0 10*3/uL (ref 0.0–0.5)
Eosinophils Relative: 0 %
HCT: 43.9 % (ref 39.0–52.0)
Hemoglobin: 15.3 g/dL (ref 13.0–17.0)
Immature Granulocytes: 1 %
Lymphocytes Relative: 14 %
Lymphs Abs: 1.8 10*3/uL (ref 0.7–4.0)
MCH: 33.6 pg (ref 26.0–34.0)
MCHC: 34.9 g/dL (ref 30.0–36.0)
MCV: 96.3 fL (ref 80.0–100.0)
Monocytes Absolute: 0.7 10*3/uL (ref 0.1–1.0)
Monocytes Relative: 6 %
Neutro Abs: 9.9 10*3/uL — ABNORMAL HIGH (ref 1.7–7.7)
Neutrophils Relative %: 79 %
Platelets: 298 10*3/uL (ref 150–400)
RBC: 4.56 MIL/uL (ref 4.22–5.81)
RDW: 12.7 % (ref 11.5–15.5)
WBC: 12.5 10*3/uL — ABNORMAL HIGH (ref 4.0–10.5)
nRBC: 0 % (ref 0.0–0.2)

## 2023-06-12 LAB — PROTIME-INR
INR: 1.3 — ABNORMAL HIGH (ref 0.8–1.2)
Prothrombin Time: 16.7 s — ABNORMAL HIGH (ref 11.4–15.2)

## 2023-06-12 LAB — RESP PANEL BY RT-PCR (RSV, FLU A&B, COVID)  RVPGX2
Influenza A by PCR: NEGATIVE
Influenza B by PCR: NEGATIVE
Resp Syncytial Virus by PCR: NEGATIVE
SARS Coronavirus 2 by RT PCR: NEGATIVE

## 2023-06-12 NOTE — ED Triage Notes (Addendum)
PT BIB EMS from home. Pt exposed to Flu by family this week and has felt weak this week. Pt family noticed hematoma to left side 2 days ago. Denies pain, denies fall. Pt is on blood thinner. Family not aware of any falls.  No other bruising noted to body. Pt is alert however slow to answer questions. Pt aware he is in hospital.   EMS 150/110 70 Rr 18 93% RA CBG 139

## 2023-06-12 NOTE — ED Provider Triage Note (Signed)
Emergency Medicine Provider Triage Evaluation Note  Bruce Sanford , a 83 y.o. male  was evaluated in triage.  Pt complains of confusion, left lateral abdominal hematoma.  He arrives via EMS.  History is obtained by EMS, patient though his confusion is evident.  Apparently patient had a fall at some point in the past few days, has had worsening confusion.  Patient has worsening hematoma, left lateral abdomen, which was concerning to the family.  EMS reports no hemodynamic instability in transport.  Review of Systems  Positive: Confusion Negative: Syncope, reportedly  Physical Exam  BP 117/86   Pulse 86   Temp 98.5 F (36.9 C)   Resp 18   SpO2 97%  Gen:   Awake, no distress elderly male sitting upright Resp:  Normal effort no increased work of breathing MSK:   Moves extremities without difficulty  Other:  Abdominal wall, left lateral large hematoma approximately 12 cm  Medical Decision Making  Medically screening exam initiated at 12:46 PM.  Appropriate orders placed.  Bruce Sanford was informed that the remainder of the evaluation will be completed by another provider, this initial triage assessment does not replace that evaluation, and the importance of remaining in the ED until their evaluation is complete.   Gerhard Munch, MD 06/12/23 1247

## 2023-06-12 NOTE — ED Notes (Signed)
Pt daughter did not want to sit and wait for pt to get a room decided to leave with him IV was removed.
